# Patient Record
Sex: Female | Born: 2000 | Race: White | Hispanic: No | Marital: Single
Health system: Southern US, Community
[De-identification: ages and names within clinical notes are randomized; demographics above are authoritative.]

## PROBLEM LIST (undated history)

## (undated) HISTORY — PX: WISDOM TOOTH EXTRACTION: SHX21

---
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
## 2019-03-12 ENCOUNTER — Other Ambulatory Visit: Payer: Self-pay

## 2019-03-12 ENCOUNTER — Encounter: Payer: Self-pay | Admitting: Physical Therapy

## 2019-03-12 ENCOUNTER — Ambulatory Visit: Payer: BC Managed Care – PPO | Attending: Physician Assistant | Admitting: Physical Therapy

## 2019-03-12 DIAGNOSIS — M62838 Other muscle spasm: Secondary | ICD-10-CM

## 2019-03-12 DIAGNOSIS — R278 Other lack of coordination: Secondary | ICD-10-CM

## 2019-03-12 DIAGNOSIS — R293 Abnormal posture: Secondary | ICD-10-CM | POA: Diagnosis present

## 2019-03-12 NOTE — Patient Instructions (Addendum)
    Lengthen Back rib by _ shoulder    Lie on   side , pillow between knees  Pull  arm overhead over mattress, grab the edge of mattress,pull it upward, drawing elbow away from ears  Breathing     ___  Open book to the R ( dragging arm, chin tuck)  Handout)   ___  Improving posture / less hunch - lying on back, knees bent, elbows bent   head and shoulder, elbow press exhale shoulders fall down ( do not arch low back)  5 sec,  10 reps   ___ Stretch for pelvic floor   V- slides  "v heels slide away and then back toward buttocks and then rock knee to slight ,  slide heel along at 11 o clock away from buttocks   10 reps       ___   Avoid straining pelvic floor, abdominal muscles , spine  Use log rolling technique instead of getting out of bed with your neck or the sit-up     Log rolling into and out of bed   Log rolling into and out of bed If getting out of bed on R side, Bent knees, scoot hips/ shoulder to L  Raise R arm completely overhead, rolling onto armpit  Then lower bent knees to bed to get into complete side lying position  Then drop legs off bed, and push up onto R elbow/forearm, and use L hand to push onto the bed    ___   Bear stretch at the door way  In between studying    ___  Sitting with feet under knees, on the ground , not crossed   ___  Sleeping on your back and side ( pillow  Between knees if onyour side)  Avoid belly sleeping   ____ Walking with armswings, relaxed

## 2019-03-13 NOTE — Therapy (Signed)
[place] [facility] MAIN [facility] SERVICES [address] [place], [place], [address] Phone: [phone]   Fax:  [phone]  Physical Therapy Evaluation  Patient Details  Name: Regina Pittman MRN: [id number] Date of Birth: [birth year] Referring Provider (PT): Merlinda Frederick    Encounter Date: 03/12/2019  PT End of Session - 03/13/19 1115    Visit Number  1    Number of Visits  10    Date for PT Re-Evaluation  05/22/19    PT Start Time  1100    PT Stop Time  1200    PT Time Calculation (min)  60 min    Activity Tolerance  Patient tolerated treatment well;No increased pain    Behavior During Therapy  [facility] for tasks assessed/performed        Subjective Assessment - 03/12/19 1114    Subjective 1) Pt has never been able to insert a tampon. Whenever she tries. something seems to stop it. Pt has been able to try inserting her pinky but it will not got in all the way. Pt never has had a gynecology exam. Pt takes birth control pill. Before starting the pill, her period has been frequent with bad migraines but the pill has helped her to have regular periods and no more migraines.   2) Difficulty with initiating urination: Pt has to wait a little to start urine flow especially in the morning. Pt has no difficulty completing urination.  Pt has daily bowel movements without straining.    Pertinent History  Physical activity: dance as a child to teenage years. Using treadmill daily at home 1 hours without stretching routine. At college, pt is walking all the time but doesnot keep up with an exercise routine.  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 denied LBP, fall onto tailbone.Pt sleeps on her belly.         [facility] PT Assessment - 03/13/19 1646      Assessment   Medical Diagnosis  vaginal stenosis     Referring Provider (PT)  Merlinda Frederick       Precautions   Precautions  None      Balance Screen   Has the patient  fallen in the past 6 months  No      Observation/Other Assessments   Observations  ankles crossed, slumped sitting     Scoliosis  R lumbar convex , L iliac crest higher       Single Leg Stance   Comments  genu valgus,  L        AROM   Overall AROM Comments  WFL       Strength   Overall Strength Comments  hip flex/ knee flex/ ext 3+/5, R 4+/5 . hip abd L 4-/5, R 3+/5, hip ext 3+/5 with lumbar lordosis       Palpation   Spinal mobility  Increased paraspinal . interspinal on R/ intercostals of lower ribs on R ( decreased post Tx)     SI assessment   L iliac crest. patella higher in standing.  equal ASIS/ medial malleoli in supine  ( psot Tx: standing: iliac crest levelled)        Bed Mobility   Bed Mobility  --   crunch method,      Ambulation/Gait   Gait Comments  minimal arm swing, throacic kyphosis, decreased R stance  Objective measurements completed on examination: See above findings.    Pelvic Floor Special Questions - 03/12/19 1156    External Perineal Exam  through clothing, increased tightness at B pelvic floor. plan to assess externally without clothing at next session        [facility] Adult PT Treatment/Exercise - 03/13/19 1646      Therapeutic Activites    Therapeutic Activities  --   explained pelvic floor anatomy/ deep core function , POC     Neuro Re-ed    Neuro Re-ed Details   Cued for sitting ,standing posture, HEP      Manual Therapy   Manual therapy comments  STM/MWM thoracic region to promote extension, rotation, paraspinals to address lumbar convex curve , medial glide at T/L junction                  PT Long Term Goals - 03/13/19 1116      PT LONG TERM GOAL #1   Title  Pt will demo decreased muscle tightness at pelvic floor all 3 layers  and demo proper lengthening to faciliate insertion of tampon    Time  8    Period  Weeks    Status  New    Target Date  05/08/19      PT LONG TERM GOAL #2   Title  Pt will  report being able to wear a tampon for 4 hours without discomfort in order to improve QOL    Time  10    Period  Weeks    Status  New    Target Date  05/22/19      PT LONG TERM GOAL #3   Title  Pt will demo proper body mechanics without cues  in order to minimzie strianing pelvic floor    Time  4    Period  Weeks    Status  New    Target Date  04/10/19      PT LONG TERM GOAL #4   Title  Pt will demo no forward head / thoracic kyphosis across 2 weeks in order to yield better intraabdominal pressure system for pelvic function    Time  4    Period  Weeks    Status  New    Target Date  04/10/19             Plan - 03/13/19 1648    Clinical Impression Statement  Pt is a 19 yo female who reports difficulty with inserting and wearing tampons in addition to difficulty with initiating urination which impact her QOL. Pt's MSK presentations include spinal deviations ( L lumbar convex curve), asymmetrical mm tensions, hypomobility at thoracic spine, forward head posture, increased pelvic floor mm tensions, poor posture and body mechanics which places strain/ overactivity of pelvic floor. Pt tolerated Tx without complaints after which pt demo'd increased thoracic extension, cervical retraction, scapular stabilization, less spinal deviations, and improved gait. Initiated pt on spinal and pelvic floor stretches.   Pt was provided education on etiology of Sx with anatomy, physiology explanation with images along with the benefits of customized pelvic PT Tx based on pt's medical conditions and musculoskeletal deficits.  Explained the physiology of deep core mm coordination and roles of pelvic floor function in urination, defecation, sexual function, and postural control with deep core mm system.   Pt benefits from skilled PT. Pt will be able to come to PT once a month due to pt being at an out of state college. Telehealth  was not an option due to DPT not licensed in [facility] where pt attends college.       Examination-Activity Limitations  Other;Hygiene/Grooming    Stability/Clinical Decision Making  Evolving/Moderate complexity    Clinical Decision Making  Moderate    Rehab Potential  Good    PT Frequency  1x / month    PT Duration  12 weeks   PT Treatment/Interventions  Therapeutic activities;Therapeutic exercise;Neuromuscular re-education;Manual techniques;Patient/family education;Energy conservation;Taping;Moist Heat;Scar mobilization;Gait training    Consulted and Agree with Plan of Care  Patient       Patient will benefit from skilled therapeutic intervention in order to improve the following deficits and impairments:  Decreased activity tolerance, Hypomobility, Decreased mobility, Decreased range of motion, Decreased coordination, Improper body mechanics, Decreased endurance, Increased fascial restricitons, Increased muscle spasms, Hypermobility, Postural dysfunction  Visit Diagnosis: Other muscle spasm  Abnormal posture  Other lack of coordination     Problem List There are no problems to display for this patient.   Mariane Masters ,PT, DPT, E-RYT  03/13/2019, 4:57 PM  [place] [facility] MAIN [facility] SERVICES [address] [place], [place], [address] Phone: [phone]   Fax:  [phone]  Name: Regina Pittman MRN: [id number] Date of Birth: [birth year]

## 2019-04-05 ENCOUNTER — Other Ambulatory Visit: Payer: Self-pay

## 2019-04-05 ENCOUNTER — Ambulatory Visit: Payer: BC Managed Care – PPO | Attending: Physician Assistant | Admitting: Physical Therapy

## 2019-04-05 DIAGNOSIS — M533 Sacrococcygeal disorders, not elsewhere classified: Secondary | ICD-10-CM | POA: Insufficient documentation

## 2019-04-05 DIAGNOSIS — R278 Other lack of coordination: Secondary | ICD-10-CM | POA: Diagnosis present

## 2019-04-05 DIAGNOSIS — M62838 Other muscle spasm: Secondary | ICD-10-CM | POA: Diagnosis present

## 2019-04-05 DIAGNOSIS — R293 Abnormal posture: Secondary | ICD-10-CM | POA: Diagnosis present

## 2019-04-05 NOTE — Patient Instructions (Addendum)
Stretch for pelvic floor   V- slides  "v heels slide away and then back toward buttocks and then rock knee to slight ,  slide heel along at 11 o clock away from buttocks   10 reps    Happy Baby 5 breaths  Spread toes and relax them   Pelvic Tilt    On belly: Riding horse edge of mattress  knee bent like riding a horse, move knee towards armpit and out  10 reps   Childs pose rocking on forearms ( to stretch midback as well)   Rest with pillow under chest/ belly  10 breath -   Mermaid stretch  Rocking while seated on the floor with heels to one side of the hip Heels to one side of the hip  Rock forward towards the knee that is bent , rock beck towards the opposite sitting bones   ___   Deep core 1 and 2 ( handout)  ____  To prepare for inserting tampons   Self-palpation with 1st knuckle in relaxed on back with pillows propped or one foot propped ( not in shower to no slip)  Notice inhale, opening of pelvic floor   Every other day after showering with clean fingers  Buildup to 2 knuckle depth    ___ minimze flip flopwearing  ___ Standing with more weight on the outside of foot, big ballmound done, thighs out, relax tailbone to ground like dinosaur tail

## 2019-04-06 NOTE — Therapy (Signed)
[place] MAIN [facility] SERVICES [address] [place], [place], [address] Phone: [phone]   Fax:  [phone]  Physical Therapy Treatment  Patient Details  Name: Regina Pittman MRN: [id number] Date of Birth: 06-18-2000 Referring Provider (PT): Carrie Mew    Encounter Date: 04/05/2019  PT End of Session - 04/05/19 1707    Visit Number  2    Number of Visits  10    Date for PT Re-Evaluation  05/22/19    PT Start Time  8366    PT Stop Time  1700    PT Time Calculation (min)  57 min    Activity Tolerance  Patient tolerated treatment well;No increased pain    Behavior During Therapy  [facility] for tasks assessed/performed         Subjective Assessment - 04/05/19 1606    Subjective  Pt reports she is more aware of her posture. Pt has tried sleep on her back but she finds herself on her belly upon waking.    Pertinent History  Physical activity: dance as a child to teenage years. Using treadmill daily at home 1 hours without stretching routine. At college, pt is walking all the time but doesnot keep up with an exercise routine.  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 denied LBP, fall onto tailbone.Pt sleeps on her belly.         [facility] PT Assessment - 04/06/19 1043      Observation/Other Assessments   Observations  upright posture, no rounded shoulders   wearing flip flops, flexed toes               Pelvic Floor Special Questions - 04/06/19 1043    External Perineal Exam  through clothing, increased tightness at ischiocavernosus/ bulbospongiosus, deep transversal perineal, coccygeus B          [facility] Adult PT Treatment/Exercise - 04/06/19 1042      Neuro Re-ed    Neuro Re-ed Details   cued for pelvic tilt, pelvic floor stretches, deep core       Manual Therapy   Manual therapy comments  externally through clothing : STM/MWM at mm noted in assessment                    PT Long Term Goals - 03/13/19 1116      PT LONG TERM GOAL #1   Title  Pt will demo decreased muscle tightness at pelvic floor all 3 layers  and demo proper lengthening to faciliate insertion of tampon    Time  8    Period  Weeks    Status  New    Target Date  05/08/19      PT LONG TERM GOAL #2   Title  Pt will report being able to wear a tampon for 4 hours without discomfort in order to improve QOL    Time  10    Period  Weeks    Status  New    Target Date  05/22/19      PT LONG TERM GOAL #3   Title  Pt will demo proper body mechanics without cues  in order to minimzie strianing pelvic floor    Time  4    Period  Weeks    Status  New    Target Date  04/10/19      PT LONG TERM GOAL #4   Title  Pt will demo no  forward head / thoracic kyphosis across 2 weeks in order to yield better intraabdominal pressure system for pelvic function    Time  4    Period  Weeks    Status  New    Target Date  04/10/19            Plan - 04/05/19 1708    Clinical Impression Statement  Pt demo'd equal alignment of pelvic girdle and less rounded shoulders which indicates good carry over from last session. Provided external manual Tx to minimize pelvic floor mm tightness which pt tolerated without complaints. Pt demo'd improved pelvic floor lengthening and was progressed to deep core coordinaiton and strengthening. Plan to apply regional interdependent approach as pt demo'd lower kinetic chain deficits ( genu valgus, medial collapse of feet, supination/ inversion) that contributes to pelvic floor overactivity. Pt continues to benefit from skilled PT   Examination-Activity Limitations  Other;Hygiene/Grooming    Stability/Clinical Decision Making  Evolving/Moderate complexity    Rehab Potential  Good    PT Frequency  Monthy    PT Duration  12 weeks    PT Treatment/Interventions  Therapeutic activities;Therapeutic exercise;Neuromuscular re-education;Manual  techniques;Patient/family education;Energy conservation;Taping;Moist Heat;Scar mobilization;Gait training    Consulted and Agree with Plan of Care  Patient       Patient will benefit from skilled therapeutic intervention in order to improve the following deficits and impairments:  Decreased activity tolerance, Hypomobility, Decreased mobility, Decreased range of motion, Decreased coordination, Improper body mechanics, Decreased endurance, Increased fascial restricitons, Increased muscle spasms, Hypermobility, Postural dysfunction  Visit Diagnosis: Other muscle spasm  Abnormal posture  Other lack of coordination     Problem List There are no problems to display for this patient.   Mariane Masters ,PT, DPT, E-RYT  04/06/2019, 10:44 AM  [place] [facility] MAIN [facility] SERVICES [address] [place], [place], [address] Phone: [phone]   Fax:  [phone]  Name: Yina Riviere MRN: [id number] Date of Birth: July 01, 2000

## 2019-04-19 ENCOUNTER — Ambulatory Visit: Payer: BC Managed Care – PPO

## 2019-05-10 ENCOUNTER — Other Ambulatory Visit: Payer: Self-pay

## 2019-05-10 ENCOUNTER — Ambulatory Visit: Payer: BC Managed Care – PPO | Attending: Physician Assistant | Admitting: Physical Therapy

## 2019-05-10 DIAGNOSIS — M62838 Other muscle spasm: Secondary | ICD-10-CM

## 2019-05-10 DIAGNOSIS — R278 Other lack of coordination: Secondary | ICD-10-CM

## 2019-05-10 DIAGNOSIS — R293 Abnormal posture: Secondary | ICD-10-CM | POA: Insufficient documentation

## 2019-05-10 NOTE — Therapy (Addendum)
[place] MAIN [facility] SERVICES [address] [place], [place], [address] Phone: [phone]   Fax:  [phone]  Physical Therapy Treatment  Patient Details  Name: Regina Pittman MRN: [id number] Date of Birth: 03-30-2000 Referring Provider (PT): Carrie Mew    Encounter Date: 05/10/2019  PT End of Session - 05/10/19 1650    Visit Number  3    Number of Visits  10    Date for PT Re-Evaluation  05/22/19    PT Start Time  1300    PT Stop Time  1430    PT Time Calculation (min)  90 min    Activity Tolerance  Patient tolerated treatment well;No increased pain    Behavior During Therapy  [facility] for tasks assessed/performed         Subjective Assessment - 05/10/19 1513    Subjective  Pt reported she was able to insert her finger up to one knuckle depth across the past month.  Pt has been paying attentiont her posture and standing evenly across her feet and not just on the inner arches.    Pertinent History  Physical activity: dance as a child to teenage years. Using treadmill daily at home 1 hours without stretching routine. At college, pt is walking all the time but doesnot keep up with an exercise routine.  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 denied LBP, fall onto tailbone.Pt sleeps on her belly.         [facility] PT Assessment - 05/10/19 1621      Squat   Comments  knees anterior to feet, posterior weight on heels                Pelvic Floor Special Questions - 05/10/19 1603    External Perineal Exam  tightness at ischial fossa/ rami, SIJ L > R          [facility] Adult PT Treatment/Exercise - 05/10/19 1603      Therapeutic Activites    Therapeutic Activities  Other Therapeutic Activities    Other Therapeutic Activities  explained the role of nervous system on mm tensions and technique fo body scan for relaxation , guided pt technique       Neuro Re-ed    Neuro Re-ed Details    cued for scapulothoracic strengthening with bands, sidestep minisquat techniques       Modalities   Modalities  Moist Heat      Moist Heat Therapy   Number Minutes Moist Heat  5 Minutes    Moist Heat Location  --   perineum during new HEP instructions      Manual Therapy   Manual therapy comments  STM/MWM  ischial fossa/ rami, AP mob Grade III FABDER R hip                   PT Long Term Goals - 05/10/19 1653      PT LONG TERM GOAL #1   Title  Pt will demo decreased muscle tightness at pelvic floor all 3 layers  and demo proper lengthening to faciliate insertion of tampon    Time  8    Period  Weeks    Status  Partially Met      PT LONG TERM GOAL #2   Title  Pt will report being able to wear a tampon for 4 hours without discomfort in order to improve QOL    Time  10  Period  Weeks    Status  On-going      PT LONG TERM GOAL #3   Title  Pt will demo proper body mechanics without cues  in order to minimzie strianing pelvic floor    Time  4    Period  Weeks    Status  On-going      PT LONG TERM GOAL #4   Title  Pt will demo no forward head / thoracic kyphosis across 2 weeks in order to yield better intraabdominal pressure system for pelvic function    Time  4    Period  Weeks    Status  Partially Met      PT LONG TERM GOAL #5   Title  Pt will demo IND with resistance band and fitness exercsies with proper technique and modifications to minimize overactivity of pelvic floor    Time  8    Period  Weeks    Status  New    Target Date  07/05/19            Plan - 05/10/19 1651    Clinical Impression Statement  Pt is progressing well with decreased pelvic floor mm tightness of 1st layer mm but requried further external treatments to decrease tensions at 2-3rd layers ( posterior > anterior) today. Pt demo'd increased mobility, lengthening of pelvic floor mm, and less tenderness post Tx. Progressed with relaxation training with body scan technique. Progressed  with minisquats, scapulothoracic strengthening with cues for proper tecnique and alignment to promote lower kinetic chain co-activation to minimize medial collapse of arches and thoracic kyphosis. Provided education on dilator use and plan to educate thoroughly at next session. Pt continues to benefit from skilled PT.    Examination-Activity Limitations  Other;Hygiene/Grooming    Stability/Clinical Decision Making  Evolving/Moderate complexity    Rehab Potential  Good    PT Frequency  Monthy    PT Duration  12 weeks    PT Treatment/Interventions  Therapeutic activities;Therapeutic exercise;Neuromuscular re-education;Manual techniques;Patient/family education;Energy conservation;Taping;Moist Heat;Scar mobilization;Gait training    Consulted and Agree with Plan of Care  Patient       Patient will benefit from skilled therapeutic intervention in order to improve the following deficits and impairments:  Decreased activity tolerance, Hypomobility, Decreased mobility, Decreased range of motion, Decreased coordination, Improper body mechanics, Decreased endurance, Increased fascial restricitons, Increased muscle spasms, Hypermobility, Postural dysfunction  Visit Diagnosis: Other muscle spasm  Abnormal posture  Other lack of coordination     Problem List There are no problems to display for this patient.   Jerl Mina ,PT, DPT, E-RYT  05/10/2019, 4:56 PM  [place] MAIN [facility] SERVICES [address] [place], [place], [address] Phone: [phone]   Fax:  [phone]  Name: Regina Pittman MRN: [id number] Date of Birth: July 21, 2000

## 2019-05-10 NOTE — Patient Instructions (Addendum)
Lying on back, knees bent    band under ballmounds  while laying on back w/ knees bent  "W" exercise with blue band   10 reps x 2 sets   Band is placed under feet, knees bent, feet are hip width apart Hold band with thumbs point out, keep upper arm and elbow touching the bed the whole time  - inhale and then exhale pull bands by bending elbows hands move in a "w"  (feel shoulder blades squeezing)    ___  Minisquat: Scoot buttocks back slight, hinge like you are looking at your reflection on a pond  Knees behind toes,  Inhale to "smell flowers"  Exhale on the rise "like rocket"  Do not lock knees, have more weight across ballmounds of feet, toes relaxed   THEN HALF step on both feet first lap with left foot leading down a hall way = 1 lap  Repeated with other foot leading =1 lap  2 laps each side    After wards:  stretch figure 4  5 breaths   ____     Walking with band, thumbs up and hands held behind back by back pockets , body face away from door  3 steps forward and back   6 mins    Wider knees,  ballmounds not just on the outside of foot   slow stepping back with shoulders ahead, ballmounds down and then heel       __  Body scan ( emailed) for relaxation

## 2019-06-26 ENCOUNTER — Other Ambulatory Visit: Payer: Self-pay

## 2019-06-26 ENCOUNTER — Ambulatory Visit: Payer: BC Managed Care – PPO | Attending: Physician Assistant | Admitting: Physical Therapy

## 2019-06-26 DIAGNOSIS — M533 Sacrococcygeal disorders, not elsewhere classified: Secondary | ICD-10-CM | POA: Insufficient documentation

## 2019-06-26 DIAGNOSIS — R278 Other lack of coordination: Secondary | ICD-10-CM | POA: Insufficient documentation

## 2019-06-26 DIAGNOSIS — R293 Abnormal posture: Secondary | ICD-10-CM | POA: Insufficient documentation

## 2019-06-26 DIAGNOSIS — M62838 Other muscle spasm: Secondary | ICD-10-CM | POA: Insufficient documentation

## 2019-06-26 NOTE — Patient Instructions (Signed)
Pelvic circles,   Marjo Bicker pose paying attention to hands and shoulder down, chin tuck, toes tucked  and not dipping lowback   Restorative pose with folded flat sheet to place under rib and mid thigh for gentle pelvic tilt anterior

## 2019-06-27 NOTE — Therapy (Signed)
[place] MAIN [facility] SERVICES [address] [place], [place], [address] Phone: [phone]   Fax:  [phone]  Physical Therapy Treatment / Progress Note  Patient Details  Name: Regina Pittman MRN: [id number] Date of Birth: 03/13/2000 Referring Provider (PT): Carrie Mew    Encounter Date: 06/26/2019  PT End of Session - 06/26/19 1751    Visit Number  4    Number of Visits  14    Date for PT Re-Evaluation  09/04/19    PT Start Time  0258    PT Stop Time  1704    PT Time Calculation (min)  61 min    Activity Tolerance  Patient tolerated treatment well;No increased pain    Behavior During Therapy  [facility] for tasks assessed/performed        Subjective Assessment - 06/26/19 1707    Subjective  Pt has not been able to insert a tampon.    Pertinent History  Physical activity: dance as a child to teenage years. Using treadmill daily at home 1 hours without stretching routine. At college, pt is walking all the time but doesnot keep up with an exercise routine.  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 denied LBP, fall onto tailbone.Pt sleeps on her belly.                     Pelvic Floor Special Questions - 06/27/19 1541    External Palpation  flexed coccyx     Pelvic Floor Internal Exam  pt consented verbally without contraindications     Exam Type  Vaginal    Palpation  tightness/ tenderness  at 6 o' clock , 5 -7 o'clock  1-2nd layer         [facility] Adult PT Treatment/Exercise - 06/27/19 1544      Neuro Re-ed    Neuro Re-ed Details   cued for anterior tilt of pelvic floor      Modalities   Modalities  Moist Heat      Moist Heat Therapy   Number Minutes Moist Heat  5 Minutes    Moist Heat Location  --   perineum through sheets     Manual Therapy   Internal Pelvic Floor  STM/MWM at 5-7 o clock 1-2 nd layers,                   PT Long Term Goals - 06/27/19  1547      PT LONG TERM GOAL #1   Title  Pt will demo decreased muscle tightness at pelvic floor all 3 layers  and demo proper lengthening to faciliate insertion of tampon    Time  8    Period  Weeks    Status  Partially Met      PT LONG TERM GOAL #2   Title  Pt will report being able to wear a tampon for 4 hours without discomfort in order to improve QOL    Time  10    Period  Weeks    Status  On-going      PT LONG TERM GOAL #3   Title  Pt will demo proper body mechanics without cues  in order to minimzie strianing pelvic floor    Time  4    Period  Weeks    Status  On-going      PT LONG TERM GOAL #4   Title  Pt will demo no  forward head / thoracic kyphosis across 2 weeks in order to yield better intraabdominal pressure system for pelvic function    Time  4    Period  Weeks    Status  Achieved      PT LONG TERM GOAL #5   Title  Pt will demo IND with resistance band and fitness exercsies with proper technique and modifications to minimize overactivity of pelvic floor    Time  8    Period  Weeks    Status  On-going            Plan - 06/26/19 1716    Clinical Impression Statement Pt is progressing well across the past 4 visits with improved posture, less hip and back mm tightness, gradually decreasing pelvic floor mm tightness. Pt no longer demo hyperextended / genu valgus knees/ pronation of feet which will help minimize further tightness of pelvic floor. Pt's coccyx was in a flexed position which was addressed with manual Tx and further internal manual Tx was required to minimize tightness of mm at 1-2 nd layers. After performing external Tx to correct flexed coccyx, pt demo'd increased lengthening of 2nd layer of pelvic floor with less tenderness. Pt continues to benefit from skilled PT.  Pt had been coming once a month due to her college schedule and will be able to come more frequently in the upcoming weeks which will help with her progress.     Examination-Activity  Limitations  Other;Hygiene/Grooming    Stability/Clinical Decision Making  Evolving/Moderate complexity    Rehab Potential  Good    PT Frequency  Monthy    PT Duration  12 weeks    PT Treatment/Interventions  Therapeutic activities;Therapeutic exercise;Neuromuscular re-education;Manual techniques;Patient/family education;Energy conservation;Taping;Moist Heat;Scar mobilization;Gait training    Consulted and Agree with Plan of Care  Patient       Patient will benefit from skilled therapeutic intervention in order to improve the following deficits and impairments:  Decreased activity tolerance, Hypomobility, Decreased mobility, Decreased range of motion, Decreased coordination, Improper body mechanics, Decreased endurance, Increased fascial restricitons, Increased muscle spasms, Hypermobility, Postural dysfunction  Visit Diagnosis: Other muscle spasm  Abnormal posture  Other lack of coordination     Problem List There are no problems to display for this patient.   Jerl Mina ,PT, DPT, E-RYT  06/27/2019, 3:47 PM  [place] MAIN [facility] SERVICES [address] [place], [place], [address] Phone: [phone]   Fax:  [phone]  Name: Regina Pittman MRN: [id number] Date of Birth: 07-Jul-2000

## 2019-07-03 ENCOUNTER — Other Ambulatory Visit: Payer: Self-pay

## 2019-07-03 ENCOUNTER — Ambulatory Visit: Payer: BC Managed Care – PPO | Admitting: Physical Therapy

## 2019-07-03 DIAGNOSIS — M62838 Other muscle spasm: Secondary | ICD-10-CM | POA: Diagnosis not present

## 2019-07-03 DIAGNOSIS — R278 Other lack of coordination: Secondary | ICD-10-CM

## 2019-07-03 DIAGNOSIS — R293 Abnormal posture: Secondary | ICD-10-CM

## 2019-07-04 NOTE — Patient Instructions (Signed)
Feet care :  Self -feet massage  ? ?Handshake : fingers between toes, moving ballmounds/toes back and forth several times while other hand anchors at arch. Do the same at the hind/mid foot.  ?Heel to toes upward to a letter Big Letter T strokes to spread ballmounds and toes, several times, pinch between webs of toes  ?Run finger tips along top of foot between long bones "comb between the bones"  ? ? ?Wiggle toes and spread them out when relaxing  ? ?

## 2019-07-04 NOTE — Therapy (Signed)
[place] MAIN [facility] SERVICES [address] [place], [place], [address] Phone: [phone]   Fax:  [phone]  Physical Therapy Treatment  Patient Details  Name: Regina Pittman MRN: [id number] Date of Birth: [birth year] Referring Provider (PT): Carrie Mew    Encounter Date: 07/03/2019  PT End of Session - 07/04/19 0955    Visit Number  5    Number of Visits  14    Date for PT Re-Evaluation  09/04/19    PT Start Time  4403    PT Stop Time  4742    PT Time Calculation (min)  50 min    Activity Tolerance  Patient tolerated treatment well;No increased pain    Behavior During Therapy  [facility] for tasks assessed/performed          Subjective Assessment - 07/04/19 0955    Subjective  Pt felt sore after last session but it felt better.    Pertinent History  Physical activity: dance as a child to teenage years. Using treadmill daily at home 1 hours without stretching routine. At college, pt is walking all the time but doesnot keep up with an exercise routine.  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 denied LBP, fall onto tailbone.Pt sleeps on her belly.         [facility] PT Assessment - 07/04/19 0956      Palpation   Palpation comment  R instrinsic feet mm tight > L, B adductor mm, glut  tightness                  Pelvic Floor Special Questions - 07/04/19 0956    Pelvic Floor Internal Exam  pt consented verbally without contraindications     Exam Type  Vaginal    Palpation  tightness/ tenderness at ATLA/ obt int, ischial spine B  at 3rd layer         [facility] Adult PT Treatment/Exercise - 07/04/19 0957      Therapeutic Activites    Other Therapeutic Activities  explained the connection of feet relaxation to pelvic floor mm , adductor , glut relaxation      Neuro Re-ed    Neuro Re-ed Details   cued for pelvic tilt and feet message technique       Modalities   Modalities  Moist  Heat      Moist Heat Therapy   Number Minutes Moist Heat  5 Minutes    Moist Heat Location  Other (comment)   perineuma nd buttocks     Manual Therapy   Internal Pelvic Floor  STM/MWM at at 3rd layer, pt was tearful at tenderness spots at deeper areas, thus, withdrew pressure and further withdrew from internal technique when pt requested. Resorted to external techniques to minimze tightness at gluts, adductors and intrinsic feet mm                   PT Long Term Goals - 06/27/19 1547      PT LONG TERM GOAL #1   Title  Pt will demo decreased muscle tightness at pelvic floor all 3 layers  and demo proper lengthening to faciliate insertion of tampon    Time  8    Period  Weeks    Status  Partially Met      PT LONG TERM GOAL #2   Title  Pt will report being able to wear a tampon for 4 hours without discomfort  in order to improve QOL    Time  10    Period  Weeks    Status  On-going      PT LONG TERM GOAL #3   Title  Pt will demo proper body mechanics without cues  in order to minimzie strianing pelvic floor    Time  4    Period  Weeks    Status  On-going      PT LONG TERM GOAL #4   Title  Pt will demo no forward head / thoracic kyphosis across 2 weeks in order to yield better intraabdominal pressure system for pelvic function    Time  4    Period  Weeks    Status  Achieved      PT LONG TERM GOAL #5   Title  Pt will demo IND with resistance band and fitness exercsies with proper technique and modifications to minimize overactivity of pelvic floor    Time  8    Period  Weeks    Status  On-going            Plan - 07/04/19 0955    Clinical Impression Statement  Pt showed decreased 1-2 nd layer mm tensions with increased tensions at 3rd layer. Internal techniques were modified tro external techniques when pt reported tenderness and need to end internal Tx. Pt demo'd increased tightness of R intrinsic feet > L, B adductor, and gluts which were addressed.  Pt  continues to show less genu valgus which will help with longer lasting benefits from today's treatments. Explained to pt about the connection of lower kinetic chain to pelvic floor tightness. Pt was educated on self feet massage. Pt demo'd increased toe abduction and more midfoot mobility, decreased adductor/ glut tightness post Tx. Pt continues to benefit from skilled PT    Examination-Activity Limitations  Other;Hygiene/Grooming    Stability/Clinical Decision Making  Evolving/Moderate complexity    Rehab Potential  Good    PT Frequency  Monthy    PT Duration  12 weeks    PT Treatment/Interventions  Therapeutic activities;Therapeutic exercise;Neuromuscular re-education;Manual techniques;Patient/family education;Energy conservation;Taping;Moist Heat;Scar mobilization;Gait training    Consulted and Agree with Plan of Care  Patient       Patient will benefit from skilled therapeutic intervention in order to improve the following deficits and impairments:  Decreased activity tolerance, Hypomobility, Decreased mobility, Decreased range of motion, Decreased coordination, Improper body mechanics, Decreased endurance, Increased fascial restricitons, Increased muscle spasms, Hypermobility, Postural dysfunction  Visit Diagnosis: Other muscle spasm  Abnormal posture  Other lack of coordination     Problem List There are no problems to display for this patient.   Regina Pittman ,PT, DPT, E-RYT  07/04/2019, 10:02 AM  [place] MAIN [facility] SERVICES [address] [place], [place], [address] Phone: [phone]   Fax:  [phone]  Name: Regina Pittman MRN: [id number] Date of Birth: [birth year]

## 2019-07-10 ENCOUNTER — Other Ambulatory Visit: Payer: Self-pay

## 2019-07-10 ENCOUNTER — Ambulatory Visit: Payer: BC Managed Care – PPO | Admitting: Physical Therapy

## 2019-07-10 DIAGNOSIS — R293 Abnormal posture: Secondary | ICD-10-CM

## 2019-07-10 DIAGNOSIS — M62838 Other muscle spasm: Secondary | ICD-10-CM | POA: Diagnosis not present

## 2019-07-10 DIAGNOSIS — R278 Other lack of coordination: Secondary | ICD-10-CM

## 2019-07-11 NOTE — Patient Instructions (Signed)
Warrior I : Feet are hip width apart, one behind like you are on ski tracks, front knee bent over ankle but not more forward then the ankle.  Make sure 50% weight is in the front foot/leg , 50% weight is the back foot/ leg   Arms up like "V" , drop shoulders down  Palms together at the hears  3 breaths here.   Warrior II:  Raise arm up in front on the same side as your front knee, back arm up behind. Arms are aligned with the length of the mat Inhale,  Exhale and relax shoulders and ribcage  Make sure 50% weight is in the front foot/leg , 50% weight is the back foot/ leg   3 breaths here.   Extended side angle :  Feet are hip width apart, L foot one behind like you are on ski tracks,  R knee bent over ankle but not more forward then the ankle.  Make sure 50% weight is in the front foot/leg , 50% weight is the back foot/ leg    Rest R forearm lightly on top of thigh,  L hand on L hip.  Inhale lengthen spine,   Exhale turn navel to the L then the ribcage turns, look at the other wall.  Keep maintaining  50% weight is in the front foot/leg , 50% weight is the back foot/ leg  And make sure the front knee is still pointed in the toe line of the 2nd toe.   3 breaths here.    Warrior III:   ( like "T")    Unlock knee of standing leg.  lean forward/ perpendicular to ground on leg in the air, hips squared to floor, toes of back leg still on floor  TO AVOID LOW BACK PAIN, LIFT LEG AT A  LESS ANGLE FROM THE FLOOR and KEEP GAZE DOWN TO NOT PUFF OUT THE CHEST    3 breaths here.

## 2019-07-11 NOTE — Therapy (Signed)
[place] MAIN [facility] SERVICES [address] [place], [place], [address] Phone: [phone]   Fax:  [phone]  Physical Therapy Treatment  Patient Details  Name: Zuley Lutter MRN: [id number] Date of Birth: 04-09-2000 Referring Provider (PT): Carrie Mew    Encounter Date: 07/10/2019   PT End of Session - 07/10/19 1703    Visit Number 6    Number of Visits 14    Date for PT Re-Evaluation 09/04/19    PT Start Time 1602    PT Stop Time 1700    PT Time Calculation (min) 58 min    Activity Tolerance Patient tolerated treatment well;No increased pain    Behavior During Therapy [facility] for tasks assessed/performed           No past medical history on file.     Subjective Assessment - 07/11/19 1332    Subjective Pt is still not able to insert a tampon in yet    Pertinent History Physical activity: dance as a child to teenage years. Using treadmill daily at home 1 hours without stretching routine. At college, pt is walking all the time but doesnot keep up with an exercise routine.  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 denied LBP, fall onto tailbone.Pt sleeps on her belly.              [facility] PT Assessment - 07/11/19 1320      Palpation   Palpation comment L ITBand, tib ant tightness , hypomobile midfoot joint R                          [facility] Adult PT Treatment/Exercise - 07/11/19 1317      Therapeutic Activites    Other Therapeutic Activities explained principles of alignment in yoga poses       Neuro Re-ed    Neuro Re-ed Details  cued for improved knee alignment in yoga poses       Modalities   Modalities Moist Heat      Moist Heat Therapy   Number Minutes Moist Heat 5 Minutes    Moist Heat Location Other (comment)   perineum     Manual Therapy   Manual therapy comments STM/MWM tib ant, ITband     Internal Pelvic Floor STM/MWM at deeper layers of pelvic  floor, but still required lighter pressure                          PT Long Term Goals - 06/27/19 1547      PT LONG TERM GOAL #1   Title Pt will demo decreased muscle tightness at pelvic floor all 3 layers  and demo proper lengthening to faciliate insertion of tampon    Time 8    Period Weeks    Status Partially Met      PT LONG TERM GOAL #2   Title Pt will report being able to wear a tampon for 4 hours without discomfort in order to improve QOL    Time 10    Period Weeks    Status On-going      PT LONG TERM GOAL #3   Title Pt will demo proper body mechanics without cues  in order to minimzie strianing pelvic floor    Time 4    Period Weeks    Status On-going      PT LONG TERM  GOAL #4   Title Pt will demo no forward head / thoracic kyphosis across 2 weeks in order to yield better intraabdominal pressure system for pelvic function    Time 4    Period Weeks    Status Achieved      PT LONG TERM GOAL #5   Title Pt will demo IND with resistance band and fitness exercsies with proper technique and modifications to minimize overactivity of pelvic floor    Time 8    Period Weeks    Status On-going                 Plan - 07/11/19 1329    Clinical Impression Statement Pt demo'd less tightness at superficial layers of pelvic floor and tolerated internal Tx at deeper layers. External techniques were applied first to minimize L lower kinetic chain mm tightness where pt reported past injuries. Applied this approach because pt demo'd increased L sided pelvic floor tightness at last session. This approached with external techniques first facilitated less tenderness and was more tolerable to pt.   Pt's lower kinetic chain deficits ( genu valgus, pronation) were addressed with neuromuscular red-education with standing yoga poses. Pt required excessive cues for knee alignment and feet mechanics. Anticipate that by addressing these lower kinetic chain issues,   longer lasting  changes will be made toward improving  pelvic floor's tightness. Pt continues to benefit from skilled PT     Examination-Activity Limitations Other;Hygiene/Grooming    Stability/Clinical Decision Making Evolving/Moderate complexity    Rehab Potential Good    PT Frequency Monthy    PT Duration 12 weeks    PT Treatment/Interventions Therapeutic activities;Therapeutic exercise;Neuromuscular re-education;Manual techniques;Patient/family education;Energy conservation;Taping;Moist Heat;Scar mobilization;Gait training    Consulted and Agree with Plan of Care Patient           Patient will benefit from skilled therapeutic intervention in order to improve the following deficits and impairments:  Decreased activity tolerance, Hypomobility, Decreased mobility, Decreased range of motion, Decreased coordination, Improper body mechanics, Decreased endurance, Increased fascial restricitons, Increased muscle spasms, Hypermobility, Postural dysfunction  Visit Diagnosis: Other muscle spasm  Abnormal posture  Other lack of coordination     Problem List There are no problems to display for this patient.   Jerl Mina ,PT, DPT, E-RYT  07/11/2019, 1:35 PM  [place] MAIN [facility] SERVICES [address] [place], [place], [address] Phone: [phone]   Fax:  [phone]  Name: Virlee Stroschein MRN: [id number] Date of Birth: [birth year]

## 2019-07-19 ENCOUNTER — Ambulatory Visit: Payer: BC Managed Care – PPO | Admitting: Physical Therapy

## 2019-07-19 ENCOUNTER — Other Ambulatory Visit: Payer: Self-pay

## 2019-07-19 DIAGNOSIS — M62838 Other muscle spasm: Secondary | ICD-10-CM

## 2019-07-19 DIAGNOSIS — R293 Abnormal posture: Secondary | ICD-10-CM

## 2019-07-19 DIAGNOSIS — R278 Other lack of coordination: Secondary | ICD-10-CM

## 2019-07-19 NOTE — Therapy (Signed)
[place] MAIN [facility] SERVICES [address] [place], [place], [address] Phone: [phone]   Fax:  [phone]  Physical Therapy Treatment  Patient Details  Name: Regina Pittman MRN: [id number] Date of Birth: 21-Feb-2000 Referring Provider (PT): Carrie Mew    Encounter Date: 07/19/2019   PT End of Session - 07/19/19 1112    Visit Number 7    Number of Visits 14    Date for PT Re-Evaluation 09/04/19    PT Start Time 1108    PT Stop Time 1205    PT Time Calculation (min) 57 min    Activity Tolerance Patient tolerated treatment well;No increased pain    Behavior During Therapy [facility] for tasks assessed/performed              Subjective Assessment - 07/19/19 1111    Subjective Pt was not as sore after last session    Pertinent History Physical activity: dance as a child to teenage years. Using treadmill daily at home 1 hours without stretching routine. At college, pt is walking all the time but doesnot keep up with an exercise routine.  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 denied LBP, fall onto tailbone.Pt sleeps on her belly.                          Pelvic Floor Special Questions - 07/19/19 1259    External Palpation hypomobile SIJ and attachments at ischial rami / tuberosity     Pelvic Floor Internal Exam pt consented verbally without contraindications     Exam Type Vaginal    Palpation tightness/ tenderness at ATLA/ obt int, ischial spine B  at 3rd layer, deferred to external techniques at ischial rami/ tuberosity L > R   and coccyx/ sacrum               [facility] Adult PT Treatment/Exercise - 07/19/19 1300      Neuro Re-ed    Neuro Re-ed Details  cued for technique for posterior pelvic floor stretches and hamstrings       Modalities   Modalities Moist Heat      Moist Heat Therapy   Number Minutes Moist Heat 5 Minutes    Moist Heat Location --   sacrum      Manual Therapy   Manual therapy comments STM/MWM and AP mob Grade III at sacrum  STM/MWM B ischial rami / tuberosity, coccygeus     Internal Pelvic Floor STM/MWM at deeper layers of pelvic floor, but still required lighter pressure                          PT Long Term Goals - 06/27/19 1547      PT LONG TERM GOAL #1   Title Pt will demo decreased muscle tightness at pelvic floor all 3 layers  and demo proper lengthening to faciliate insertion of tampon    Time 8    Period Weeks    Status Partially Met      PT LONG TERM GOAL #2   Title Pt will report being able to wear a tampon for 4 hours without discomfort in order to improve QOL    Time 10    Period Weeks    Status On-going      PT LONG TERM GOAL #3   Title Pt will demo proper body mechanics without cues  in order to minimzie strianing pelvic floor    Time 4    Period Weeks    Status On-going      PT LONG TERM GOAL #4   Title Pt will demo no forward head / thoracic kyphosis across 2 weeks in order to yield better intraabdominal pressure system for pelvic function    Time 4    Period Weeks    Status Achieved      PT LONG TERM GOAL #5   Title Pt will demo IND with resistance band and fitness exercsies with proper technique and modifications to minimize overactivity of pelvic floor    Time 8    Period Weeks    Status On-going                 Plan - 07/19/19 1112    Clinical Impression Statement Pt demo'd decreased pelvic floor mm tightness but required external techniques to address tightness at posterior pelvic floor. Anticipate mobility at coccyx/ sacrum/ SIJ will continue to help pt advance towards her goals. Pt required cues for anterior tilt of pelvic and co-activation of feet to relax the pelvic floor. Pt continues to benefit from skilled PT    Examination-Activity Limitations Other;Hygiene/Grooming    Stability/Clinical Decision Making Evolving/Moderate complexity    Rehab Potential Good    PT  Frequency Monthy    PT Duration 12 weeks    PT Treatment/Interventions Therapeutic activities;Therapeutic exercise;Neuromuscular re-education;Manual techniques;Patient/family education;Energy conservation;Taping;Moist Heat;Scar mobilization;Gait training    Consulted and Agree with Plan of Care Patient           Patient will benefit from skilled therapeutic intervention in order to improve the following deficits and impairments:  Decreased activity tolerance, Hypomobility, Decreased mobility, Decreased range of motion, Decreased coordination, Improper body mechanics, Decreased endurance, Increased fascial restricitons, Increased muscle spasms, Hypermobility, Postural dysfunction  Visit Diagnosis: Other muscle spasm  Abnormal posture  Other lack of coordination     Problem List There are no problems to display for this patient.   Jerl Mina ,PT, DPT, E-RYT  07/19/2019, 1:02 PM  [place] MAIN [facility] SERVICES [address] [place], [place], [address] Phone: [phone]   Fax:  [phone]  Name: Regina Pittman MRN: [id number] Date of Birth: 08/28/2000

## 2019-07-19 NOTE — Patient Instructions (Signed)
Strap stretches:    Happy baby   Hamstring ( staight knee and out)    Seated with half butterfly, sit on top of pillow for anterior tilt of pelvis  Anchor heel down  5 breaths    ___  Good to avoid flipflips  Relax big toe ballmound down, and pelvic tilts in standing to notice lengthening of pelvic floor in anterior tilt  Of pelvis

## 2019-07-24 ENCOUNTER — Other Ambulatory Visit: Payer: Self-pay

## 2019-07-24 ENCOUNTER — Ambulatory Visit: Payer: BC Managed Care – PPO | Admitting: Physical Therapy

## 2019-07-24 DIAGNOSIS — R293 Abnormal posture: Secondary | ICD-10-CM

## 2019-07-24 DIAGNOSIS — R278 Other lack of coordination: Secondary | ICD-10-CM

## 2019-07-24 DIAGNOSIS — M62838 Other muscle spasm: Secondary | ICD-10-CM | POA: Diagnosis not present

## 2019-07-24 NOTE — Patient Instructions (Signed)
°  PLEATS: BALLERINA  Heel raises in ballerina position, pressing rolled towel between heels  Hands at a corner of a wall   Knees bent pointed out like a "v" , navel ( center of mass) more forward  Heels together as you lift, pointed out like a "v"  KNEES ARE ALIGNED BEHIND THE TOES TO MINIMIZE STRAIN ON THE KNEES Lower heels down slow with your  navel ( center of mass) more forward to avoid dropping down fast and rocking more weight back onto heels   10 reps     _________  Step ups   Step up L up, R up, L down, R down, exhale up and down, lower heel slow and controlled, shoulder slightly forward, center of mass moves after ballmounds contacts  10 x 3 reps each   _________

## 2019-07-25 NOTE — Therapy (Signed)
[place] MAIN [facility] SERVICES [address] [place], [place], [address] Phone: [phone]   Fax:  [phone]  Physical Therapy Treatment  Patient Details  Name: Regina Pittman MRN: [id number] Date of Birth: Dec 15, 2000 Referring Provider (PT): Carrie Mew    Encounter Date: 07/24/2019   PT End of Session - 07/24/19 1705    Number of Visits 14    Date for PT Re-Evaluation 09/04/19    PT Start Time 4627    PT Stop Time 1700    PT Time Calculation (min) 56 min    Activity Tolerance Patient tolerated treatment well;No increased pain    Behavior During Therapy [facility] for tasks assessed/performed           No past medical history on file.  There were no vitals filed for this visit.   Subjective Assessment - 07/24/19 1604    Subjective Pt reported no issues from last session. Pt started her period today    Pertinent History Physical activity: dance as a child to teenage years. Using treadmill daily at home 1 hours without stretching routine. At college, pt is walking all the time but doesnot keep up with an exercise routine.  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 denied LBP, fall onto tailbone.Pt sleeps on her belly.              [facility] PT Assessment - 07/25/19 0958      Palpation   SI assessment  hypomobile L PSIS     Palpation comment tight extensor mm B ( L > R) , hypomobile fibula ( lacking tightness) B                          [facility] Adult PT Treatment/Exercise - 07/25/19 0957      Neuro Re-ed    Neuro Re-ed Details  cued for more PF/INV in new exercsies       Modalities   Modalities Moist Heat      Moist Heat Therapy   Number Minutes Moist Heat 5 Minutes    Moist Heat Location --   antetrior legs     Manual Therapy   Manual therapy comments STM/MWM at extensor mm L > R at leg , fibula mob to promote DF/EV, PA mob, rotational mob at L PSIS                           PT Long Term Goals - 06/27/19 1547      PT LONG TERM GOAL #1   Title Pt will demo decreased muscle tightness at pelvic floor all 3 layers  and demo proper lengthening to faciliate insertion of tampon    Time 8    Period Weeks    Status Partially Met      PT LONG TERM GOAL #2   Title Pt will report being able to wear a tampon for 4 hours without discomfort in order to improve QOL    Time 10    Period Weeks    Status On-going      PT LONG TERM GOAL #3   Title Pt will demo proper body mechanics without cues  in order to minimzie strianing pelvic floor    Time 4    Period Weeks    Status On-going      PT LONG TERM GOAL #4   Title Pt  will demo no forward head / thoracic kyphosis across 2 weeks in order to yield better intraabdominal pressure system for pelvic function    Time 4    Period Weeks    Status Achieved      PT LONG TERM GOAL #5   Title Pt will demo IND with resistance band and fitness exercsies with proper technique and modifications to minimize overactivity of pelvic floor    Time 8    Period Weeks    Status On-going                 Plan - 07/25/19 0953    Clinical Impression Statement Pt is progressing well with decreasing tightness along lower kinetic chain which will help with less pelvic floor overactivty. External technique with regional interdependent approaches were applied today. Pt required cues for more hip abd/ER, more transverse arch co-activation at the feet to minimize genu valgus/ pronation. Upcoming sessions will focus on return to fuitness exercises as well. Pt continues to benefit from skilled PT    Examination-Activity Limitations Other;Hygiene/Grooming    Stability/Clinical Decision Making Evolving/Moderate complexity    Rehab Potential Good    PT Frequency Monthy    PT Duration 12 weeks    PT Treatment/Interventions Therapeutic activities;Therapeutic exercise;Neuromuscular re-education;Manual  techniques;Patient/family education;Energy conservation;Taping;Moist Heat;Scar mobilization;Gait training    Consulted and Agree with Plan of Care Patient           Patient will benefit from skilled therapeutic intervention in order to improve the following deficits and impairments:  Decreased activity tolerance, Hypomobility, Decreased mobility, Decreased range of motion, Decreased coordination, Improper body mechanics, Decreased endurance, Increased fascial restricitons, Increased muscle spasms, Hypermobility, Postural dysfunction  Visit Diagnosis: Other muscle spasm  Abnormal posture  Other lack of coordination     Problem List There are no problems to display for this patient.   Jerl Mina ,PT, DPT, E-RYT  07/25/2019, 10:02 AM  [place] MAIN [facility] SERVICES [address] [place], [place], [address] Phone: [phone]   Fax:  [phone]  Name: Regina Pittman MRN: [id number] Date of Birth: 2000-02-20

## 2019-07-31 ENCOUNTER — Ambulatory Visit: Payer: BC Managed Care – PPO | Attending: Physician Assistant | Admitting: Physical Therapy

## 2019-07-31 ENCOUNTER — Other Ambulatory Visit: Payer: Self-pay

## 2019-07-31 DIAGNOSIS — R278 Other lack of coordination: Secondary | ICD-10-CM | POA: Insufficient documentation

## 2019-07-31 DIAGNOSIS — M62838 Other muscle spasm: Secondary | ICD-10-CM | POA: Diagnosis not present

## 2019-07-31 DIAGNOSIS — R293 Abnormal posture: Secondary | ICD-10-CM | POA: Insufficient documentation

## 2019-07-31 DIAGNOSIS — M533 Sacrococcygeal disorders, not elsewhere classified: Secondary | ICD-10-CM | POA: Insufficient documentation

## 2019-07-31 NOTE — Patient Instructions (Addendum)
Step back with opposite arm up  30 reps  Push ballmounds down without knee pointing inwards   __  step with L foot forward, heel raises together, 3 steps , lower heel slow and then other foot forward  10 reps  Do not lock knees

## 2019-07-31 NOTE — Therapy (Signed)
[place] MAIN [facility] SERVICES [address] [place], [place], [address] Phone: [phone]   Fax:  [phone]  Physical Therapy Treatment  Patient Details  Name: Regina Pittman MRN: [id number] Date of Birth: 2000/10/03 Referring Provider (PT): Carrie Mew    Encounter Date: 07/31/2019   PT End of Session - 07/31/19 1710    Visit Number 6    Number of Visits 14    Date for PT Re-Evaluation 09/04/19    PT Start Time 4944    PT Stop Time 1710    PT Time Calculation (min) 62 min    Activity Tolerance Patient tolerated treatment well;No increased pain    Behavior During Therapy [facility] for tasks assessed/performed             Subjective Assessment - 07/31/19 1704    Subjective Pt reported she was able to insert a tampon halfway in    Pertinent History Physical activity: dance as a child to teenage years. Using treadmill daily at home 1 hours without stretching routine. At college, pt is walking all the time but doesnot keep up with an exercise routine.  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 denied LBP, fall onto tailbone.Pt sleeps on her belly.              [facility] PT Assessment - 07/31/19 1708      Coordination   Gross Motor Movements are Fluid and Coordinated --   chest breathing, pelvic floor dyscoordination                     Pelvic Floor Special Questions - 07/31/19 1706    External Palpation hypomobile L PSIS SIJ     Pelvic Floor Internal Exam pt consented verbally without contraindications     Exam Type Vaginal    Palpation tightness/ tenderness  11-2 oclock and 3-6 o'clock at 3rd layer              [facility] Adult PT Treatment/Exercise - 07/31/19 1704      Neuro Re-ed    Neuro Re-ed Details  cued for less chest breathing, more co=activatin of the feet and alignment of knees and hips to relax pelvic floor, and in standing exercises, no locking knees       Modalities   Modalities Moist Heat      Moist Heat Therapy   Number Minutes Moist Heat 5 Minutes    Moist Heat Location --   perineum/ sacrum     Manual Therapy   Manual therapy comments L SIJ Grade III mobs PSIS     Internal Pelvic Floor MWM . STM at 11-2 oclock and 3-6 o'clock at 3rd layer                        PT Long Term Goals - 06/27/19 1547      PT LONG TERM GOAL #1   Title Pt will demo decreased muscle tightness at pelvic floor all 3 layers  and demo proper lengthening to faciliate insertion of tampon    Time 8    Period Weeks    Status Partially Met      PT LONG TERM GOAL #2   Title Pt will report being able to wear a tampon for 4 hours without discomfort in order to improve QOL    Time 10    Period Weeks    Status On-going  PT LONG TERM GOAL #3   Title Pt will demo proper body mechanics without cues  in order to minimzie strianing pelvic floor    Time 4    Period Weeks    Status On-going      PT LONG TERM GOAL #4   Title Pt will demo no forward head / thoracic kyphosis across 2 weeks in order to yield better intraabdominal pressure system for pelvic function    Time 4    Period Weeks    Status Achieved      PT LONG TERM GOAL #5   Title Pt will demo IND with resistance band and fitness exercsies with proper technique and modifications to minimize overactivity of pelvic floor    Time 8    Period Weeks    Status On-going                 Plan - 07/31/19 1710    Clinical Impression Statement Pt required further manual Tx at L SIJ and excessive ceus for co-activation of lower kinetic chain to minimize pelvic overactivity. Pt demo'd decreased pelvic floor mm tightness. Pt is progressing well as pt reported she was able to insert a tampon halfway. Anticipate pt will be able to achieve insertion of full tampon with the improvements achieved today and with upcoming sessions. Incorporated standing propioception exercises with cues for less ankle  inversion when in PF. These exercises were made as dance movements as pt danced in the past for better learning styles. Pt continues to benefit from skilled PT    Examination-Activity Limitations Other;Hygiene/Grooming    Stability/Clinical Decision Making Evolving/Moderate complexity    Rehab Potential Good    PT Frequency Monthy    PT Duration 12 weeks    PT Treatment/Interventions Therapeutic activities;Therapeutic exercise;Neuromuscular re-education;Manual techniques;Patient/family education;Energy conservation;Taping;Moist Heat;Scar mobilization;Gait training    Consulted and Agree with Plan of Care Patient           Patient will benefit from skilled therapeutic intervention in order to improve the following deficits and impairments:  Decreased activity tolerance, Hypomobility, Decreased mobility, Decreased range of motion, Decreased coordination, Improper body mechanics, Decreased endurance, Increased fascial restricitons, Increased muscle spasms, Hypermobility, Postural dysfunction  Visit Diagnosis: Other muscle spasm  Abnormal posture  Other lack of coordination     Problem List There are no problems to display for this patient.   Jerl Mina ,PT, DPT, E-RYT   07/31/2019, 5:27 PM  [place] MAIN [facility] SERVICES [address] [place], [place], [address] Phone: [phone]   Fax:  [phone]  Name: Brytney Somes MRN: [id number] Date of Birth: 11-29-2000

## 2019-08-06 ENCOUNTER — Ambulatory Visit: Payer: BC Managed Care – PPO | Admitting: Physical Therapy

## 2019-08-06 ENCOUNTER — Other Ambulatory Visit: Payer: Self-pay

## 2019-08-06 DIAGNOSIS — M62838 Other muscle spasm: Secondary | ICD-10-CM

## 2019-08-06 DIAGNOSIS — R278 Other lack of coordination: Secondary | ICD-10-CM

## 2019-08-06 DIAGNOSIS — R293 Abnormal posture: Secondary | ICD-10-CM

## 2019-08-06 NOTE — Therapy (Signed)
[place] MAIN [facility] SERVICES [address] [place], [place], [address] Phone: [phone]   Fax:  [phone]  Physical Therapy Treatment  Patient Details  Name: Regina Pittman MRN: [id number] Date of Birth: [birth year] Referring Provider (PT): Carrie Mew    Encounter Date: 08/06/2019   PT End of Session - 08/06/19 1738    Visit Number 7    Number of Visits 14    Date for PT Re-Evaluation 09/04/19    PT Start Time 6438    PT Stop Time 1700    PT Time Calculation (min) 56 min    Activity Tolerance Patient tolerated treatment well;No increased pain    Behavior During Therapy [facility] for tasks assessed/performed             Subjective Assessment - 08/06/19 1611    Subjective Pt tried using dilator for only a few minutes but it was not inserted very far. Pt felt sore after last session but it went away    Pertinent History Physical activity: dance as a child to teenage years. Using treadmill daily at home 1 hours without stretching routine. At college, pt is walking all the time but doesnot keep up with an exercise routine.  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 denied LBP, fall onto tailbone.Pt sleeps on her belly.                          Pelvic Floor Special Questions - 08/06/19 1659    Pelvic Floor Internal Exam pt consented verbally without contraindications     Exam Type Vaginal    Palpation no flinching  tenderness  11-2 oclock and 3-6 o'clock at 3rd layer .  Pt able to relax IND without STM/MWM, cued for feet propioception for relaxation               [facility] Adult PT Treatment/Exercise - 08/06/19 1657      Neuro Re-ed    Neuro Re-ed Details  cued for more transverse arch co-activation to relax and applied pain science education      Modalities   Modalities Moist Heat      Moist Heat Therapy   Moist Heat Location --   perineum/ sacrum     Manual Therapy    Manual therapy comments L SIJ Grade III mobs rotational/ FADDIR PA mobs to minimize lower hip abductor mm tightness     Internal Pelvic Floor cued for more transverse arch co-activation to relax and applied pain science education                         PT Long Term Goals - 06/27/19 1547      PT LONG TERM GOAL #1   Title Pt will demo decreased muscle tightness at pelvic floor all 3 layers  and demo proper lengthening to faciliate insertion of tampon    Time 8    Period Weeks    Status Partially Met      PT LONG TERM GOAL #2   Title Pt will report being able to wear a tampon for 4 hours without discomfort in order to improve QOL    Time 10    Period Weeks    Status On-going      PT LONG TERM GOAL #3   Title Pt will demo proper body mechanics without cues  in order to minimzie  strianing pelvic floor    Time 4    Period Weeks    Status On-going      PT LONG TERM GOAL #4   Title Pt will demo no forward head / thoracic kyphosis across 2 weeks in order to yield better intraabdominal pressure system for pelvic function    Time 4    Period Weeks    Status Achieved      PT LONG TERM GOAL #5   Title Pt will demo IND with resistance band and fitness exercsies with proper technique and modifications to minimize overactivity of pelvic floor    Time 8    Period Weeks    Status On-going                 Plan - 08/06/19 1739    Clinical Impression Statement Pt continues to show decreasing pelvic floor mm tightness and able to relax the 1-3rd layer muscles IND after receiving L SIJ mobilizations. Pt required pain science education to have long lasting changes to responding to pelvic pain. Pt requried cues to co-activation of her feet to rpromote more pelvic floor relaxation. Pt continues to benefit from skilled PT.    Examination-Activity Limitations Other;Hygiene/Grooming    Stability/Clinical Decision Making Evolving/Moderate complexity    Rehab Potential Good    PT  Frequency Monthy    PT Duration 12 weeks    PT Treatment/Interventions Therapeutic activities;Therapeutic exercise;Neuromuscular re-education;Manual techniques;Patient/family education;Energy conservation;Taping;Moist Heat;Scar mobilization;Gait training    Consulted and Agree with Plan of Care Patient           Patient will benefit from skilled therapeutic intervention in order to improve the following deficits and impairments:  Decreased activity tolerance, Hypomobility, Decreased mobility, Decreased range of motion, Decreased coordination, Improper body mechanics, Decreased endurance, Increased fascial restricitons, Increased muscle spasms, Hypermobility, Postural dysfunction  Visit Diagnosis: Other muscle spasm  Abnormal posture  Other lack of coordination     Problem List There are no problems to display for this patient.   Jerl Mina ,PT, DPT, E-RYT  08/06/2019, 5:42 PM  [place] MAIN [facility] SERVICES [address] [place], [place], [address] Phone: [phone]   Fax:  [phone]  Name: Regina Pittman MRN: [id number] Date of Birth: 05-Feb-2000

## 2019-08-13 ENCOUNTER — Other Ambulatory Visit: Payer: Self-pay

## 2019-08-13 ENCOUNTER — Ambulatory Visit: Payer: BC Managed Care – PPO | Admitting: Physical Therapy

## 2019-08-13 DIAGNOSIS — R278 Other lack of coordination: Secondary | ICD-10-CM

## 2019-08-13 DIAGNOSIS — R293 Abnormal posture: Secondary | ICD-10-CM

## 2019-08-13 DIAGNOSIS — M62838 Other muscle spasm: Secondary | ICD-10-CM | POA: Diagnosis not present

## 2019-08-13 NOTE — Patient Instructions (Addendum)
Knee circles    Propped foot at stairs, wide knee High arches    Propped forearm on kitchen counter  Standing leg arches high Small foot circles of leg in the air

## 2019-08-13 NOTE — Therapy (Signed)
[place] MAIN [facility] SERVICES [address] [place], [place], [address] Phone: [phone]   Fax:  [phone]  Physical Therapy Treatment  Patient Details  Name: Regina Pittman MRN: [id number] Date of Birth: 10/28/2000 Referring Provider (PT): Carrie Mew    Encounter Date: 08/13/2019   PT End of Session - 08/13/19 6606    Visit Number 8    Number of Visits 14    Date for PT Re-Evaluation 09/04/19    PT Start Time 1600    PT Stop Time 3016    PT Time Calculation (min) 45 min    Activity Tolerance Patient tolerated treatment well;No increased pain    Behavior During Therapy [facility] for tasks assessed/performed              Subjective Assessment - 08/13/19 1605    Subjective Pt reported no soreness from last session    Pertinent History Physical activity: dance as a child to teenage years. Using treadmill daily at home 1 hours without stretching routine. At college, pt is walking all the time but doesnot keep up with an exercise routine.  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 denied LBP, fall onto tailbone.Pt sleeps on her belly.                          Pelvic Floor Special Questions - 08/13/19 1634    Pelvic Floor Internal Exam pt consented verbally without contraindications     Exam Type Vaginal    Palpation 7-9 o'clock at 3rd layer .              [place] Adult PT Treatment/Exercise - 08/13/19 1635      Neuro Re-ed    Neuro Re-ed Details  cued for more posterior pelvic floor relaxation       Modalities   Modalities Moist Heat      Moist Heat Therapy   Moist Heat Location --   perineum/ sacrum     Manual Therapy   Internal Pelvic Floor STM/MWM with breathing at 7-9 oclock 3rd layer , faciliation of more relaxation beyond ATLA/ ischial spine                        PT Long Term Goals - 06/27/19 1547      PT LONG TERM GOAL #1   Title Pt will  demo decreased muscle tightness at pelvic floor all 3 layers  and demo proper lengthening to faciliate insertion of tampon    Time 8    Period Weeks    Status Partially Met      PT LONG TERM GOAL #2   Title Pt will report being able to wear a tampon for 4 hours without discomfort in order to improve QOL    Time 10    Period Weeks    Status On-going      PT LONG TERM GOAL #3   Title Pt will demo proper body mechanics without cues  in order to minimzie strianing pelvic floor    Time 4    Period Weeks    Status On-going      PT LONG TERM GOAL #4   Title Pt will demo no forward head / thoracic kyphosis across 2 weeks in order to yield better intraabdominal pressure system for pelvic function    Time 4    Period Weeks  Status Achieved      PT LONG TERM GOAL #5   Title Pt will demo IND with resistance band and fitness exercsies with proper technique and modifications to minimize overactivity of pelvic floor    Time 8    Period Weeks    Status On-going                 Plan - 08/13/19 1644    Clinical Impression Statement Pt demo'd less pelvic floor mm tightness with remaining tightness more R posterior deep layers. Manual Tx helped to lengthen this area. Progressed pt to more stretches and SLS hip mobility exercises. Pt is ready for dilator education and use and plan to incorporate at next session. Pt continues to benefit from skilled PT.    Examination-Activity Limitations Other;Hygiene/Grooming    Stability/Clinical Decision Making Evolving/Moderate complexity    Rehab Potential Good    PT Frequency Monthy    PT Duration 12 weeks    PT Treatment/Interventions Therapeutic activities;Therapeutic exercise;Neuromuscular re-education;Manual techniques;Patient/family education;Energy conservation;Taping;Moist Heat;Scar mobilization;Gait training    Consulted and Agree with Plan of Care Patient           Patient will benefit from skilled therapeutic intervention in order to  improve the following deficits and impairments:  Decreased activity tolerance, Hypomobility, Decreased mobility, Decreased range of motion, Decreased coordination, Improper body mechanics, Decreased endurance, Increased fascial restricitons, Increased muscle spasms, Hypermobility, Postural dysfunction  Visit Diagnosis: Other muscle spasm  Abnormal posture  Other lack of coordination     Problem List There are no problems to display for this patient.   Jerl Mina ,PT, DPT, E-RYT  08/13/2019, 4:57 PM  [place] MAIN [facility] SERVICES [address] [place], [place], [address] Phone: [phone]   Fax:  [phone]  Name: Regina Pittman MRN: [id number] Date of Birth: April 16, 2000

## 2019-08-20 ENCOUNTER — Other Ambulatory Visit: Payer: Self-pay

## 2019-08-20 ENCOUNTER — Ambulatory Visit: Payer: BC Managed Care – PPO | Admitting: Physical Therapy

## 2019-08-20 DIAGNOSIS — R278 Other lack of coordination: Secondary | ICD-10-CM

## 2019-08-20 DIAGNOSIS — M62838 Other muscle spasm: Secondary | ICD-10-CM

## 2019-08-20 DIAGNOSIS — R293 Abnormal posture: Secondary | ICD-10-CM

## 2019-08-21 NOTE — Therapy (Signed)
[place] MAIN [facility] SERVICES [address] [place], [place], [address] Phone: [phone]   Fax:  [phone]  Physical Therapy Treatment  Patient Details  Name: Regina Pittman MRN: [id number] Date of Birth: 05-Oct-2000 Referring Provider (PT): Carrie Mew    Encounter Date: 08/20/2019   PT End of Session - 08/20/19 1608    Visit Number 9    Number of Visits 14    Date for PT Re-Evaluation 09/04/19    PT Start Time 3524    PT Stop Time 8185    PT Time Calculation (min) 45 min    Activity Tolerance Patient tolerated treatment well;No increased pain    Behavior During Therapy [facility] for tasks assessed/performed             Subjective Assessment - 08/20/19 1608    Subjective Pt reported tiny bit of soreness but not really. Pt is on her menstrual cycle today    Pertinent History Physical activity: dance as a child to teenage years. Using treadmill daily at home 1 hours without stretching routine. At college, pt is walking all the time but doesnot keep up with an exercise routine.  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 denied LBP, fall onto tailbone.Pt sleeps on her belly.              [facility] PT Assessment - 08/21/19 1552      Palpation   SI assessment  tightness  ischial tuberosity/ obt int / coccygeus B                          [facility] Adult PT Treatment/Exercise - 08/21/19 1550      Therapeutic Activites    Other Therapeutic Activities guided pt on insertion of tampon with plastic applicator       Neuro Re-ed    Neuro Re-ed Details  cued for visualization and practicing pelvic floor lengthening prior to insertion of tampon       Modalities   Modalities --      Manual Therapy   Manual therapy comments STM/MWM at ischial tuberosity/ obt int / coccygeus B                        PT Long Term Goals - 06/27/19 1547      PT LONG TERM GOAL #1   Title  Pt will demo decreased muscle tightness at pelvic floor all 3 layers  and demo proper lengthening to faciliate insertion of tampon    Time 8    Period Weeks    Status Partially Met      PT LONG TERM GOAL #2   Title Pt will report being able to wear a tampon for 4 hours without discomfort in order to improve QOL    Time 10    Period Weeks    Status On-going      PT LONG TERM GOAL #3   Title Pt will demo proper body mechanics without cues  in order to minimzie strianing pelvic floor    Time 4    Period Weeks    Status On-going      PT LONG TERM GOAL #4   Title Pt will demo no forward head / thoracic kyphosis across 2 weeks in order to yield better intraabdominal pressure system for pelvic function    Time 4    Period Weeks  Status Achieved      PT LONG TERM GOAL #5   Title Pt will demo IND with resistance band and fitness exercsies with proper technique and modifications to minimize overactivity of pelvic floor    Time 8    Period Weeks    Status On-going                 Plan - 08/21/19 1554    Clinical Impression Statement Pt required external manual Tx at posterior pelvic floor mm today and achieved the insertion of a small tampon with a plastic application without pain. Pt continues to benefit from skilled PT    Examination-Activity Limitations Other;Hygiene/Grooming    Stability/Clinical Decision Making Evolving/Moderate complexity    Rehab Potential Good    PT Frequency Monthy    PT Duration 12 weeks    PT Treatment/Interventions Therapeutic activities;Therapeutic exercise;Neuromuscular re-education;Manual techniques;Patient/family education;Energy conservation;Taping;Moist Heat;Scar mobilization;Gait training    Consulted and Agree with Plan of Care Patient           Patient will benefit from skilled therapeutic intervention in order to improve the following deficits and impairments:  Decreased activity tolerance, Hypomobility, Decreased mobility, Decreased  range of motion, Decreased coordination, Improper body mechanics, Decreased endurance, Increased fascial restricitons, Increased muscle spasms, Hypermobility, Postural dysfunction  Visit Diagnosis: Other muscle spasm  Abnormal posture  Other lack of coordination     Problem List There are no problems to display for this patient.   Jerl Mina ,PT, DPT, E-RYT  08/21/2019, 3:54 PM  [place] MAIN [facility] SERVICES [address] [place], [place], [address] Phone: [phone]   Fax:  [phone]  Name: Regina Pittman MRN: [id number] Date of Birth: 02-27-2000

## 2019-08-27 ENCOUNTER — Other Ambulatory Visit: Payer: Self-pay

## 2019-08-27 ENCOUNTER — Ambulatory Visit: Payer: BC Managed Care – PPO | Attending: Physician Assistant | Admitting: Physical Therapy

## 2019-08-27 DIAGNOSIS — R293 Abnormal posture: Secondary | ICD-10-CM | POA: Insufficient documentation

## 2019-08-27 DIAGNOSIS — M62838 Other muscle spasm: Secondary | ICD-10-CM | POA: Insufficient documentation

## 2019-08-27 DIAGNOSIS — R278 Other lack of coordination: Secondary | ICD-10-CM | POA: Insufficient documentation

## 2019-08-27 NOTE — Patient Instructions (Signed)
Use of dilator, small size for 10 min, every other day for 2 weeks   with towel rolled at handle for leverage   Try to see if you are ready for the next size after 2 weeks

## 2019-08-27 NOTE — Therapy (Signed)
[place] MAIN [facility] SERVICES [address] [place], [place], [address] Phone: [phone]   Fax:  [phone]  Physical Therapy Treatment / Progress Note   Patient Details  Name: Regina Pittman MRN: [id number] Date of Birth: 2000-05-13 Referring Provider (PT): Carrie Mew    Encounter Date: 08/27/2019   PT End of Session - 08/27/19 1709    Visit Number 10    Number of Visits 14    Date for PT Re-Evaluation 09/04/19    PT Start Time 3716    PT Stop Time 1700    PT Time Calculation (min) 56 min    Activity Tolerance Patient tolerated treatment well;No increased pain    Behavior During Therapy [facility] for tasks assessed/performed              Subjective Assessment - 08/27/19 1609    Subjective Pt reported she was able to insert small tampons with plastic applicators after last session.    Pertinent History Physical activity: dance as a child to teenage years. Using treadmill daily at home 1 hours without stretching routine. At college, pt is walking all the time but doesnot keep up with an exercise routine.  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 denied LBP, fall onto tailbone.Pt sleeps on her belly.                          Pelvic Floor Special Questions - 08/27/19 1705    Pelvic Floor Internal Exam pt consented verbally without contraindications     Exam Type Vaginal    Palpation tightness at 3rd layer anterior mm              [facility] Adult PT Treatment/Exercise - 08/27/19 1714      Therapeutic Activites    Other Therapeutic Activities explained anatomy/ physiology of pelvic floor functions       Neuro Re-ed    Neuro Re-ed Details  cued for  more lengthening of pelvic floor with more ab expansion       Manual Therapy   Internal Pelvic Floor STM/MWM anterior deep pelvic floor                         PT Long Term Goals - 08/27/19 1710      PT LONG  TERM GOAL #1   Title Pt will demo decreased muscle tightness at pelvic floor all 3 layers  and demo proper lengthening to faciliate insertion of tampon    Time 8    Period Weeks    Status Achieved      PT LONG TERM GOAL #2   Title Pt will report being able to wear a tampon for 4 hours without discomfort in order to improve QOL    Time 10    Period Weeks    Status Achieved      PT LONG TERM GOAL #3   Title Pt will demo proper body mechanics without cues  in order to minimzie straining pelvic floor    Time 4    Period Weeks    Status Achieved      PT LONG TERM GOAL #4   Title Pt will demo no forward head / thoracic kyphosis across 2 weeks in order to yield better intraabdominal pressure system for pelvic function    Time 4    Period Weeks    Status  Achieved      PT LONG TERM GOAL #5   Title Pt will demo IND with resistance band and fitness exercsies with proper technique and modifications to minimize overactivity of pelvic floor    Time 8    Period Weeks    Status Partially Met                 Plan - 08/27/19 1709    Clinical Impression Statement Pt achieved 4/5 goals and is progressing well towards remaining goal. Pt has demo'd decreased pelvic floor and demo'd improved pelvic floor coordination. Pt has been able to insert a small tampon and is working on continued decreasing pelvic floor tightness with use of small dilator. Pt also showed improved flexibility and decreased mm tensions at her B lower kinetic chain, increased SIJ mobility, less genu valgus/ pronation, more upright posture. These improvements continue to help minimize pelvic tensions long term. Pt will progress towards fitness exercises in upcoming session.   Pt continues to benefit from skilled PT.     Examination-Activity Limitations Other;Hygiene/Grooming    Stability/Clinical Decision Making Evolving/Moderate complexity    Rehab Potential Good    PT Frequency Monthy    PT Duration 12 weeks    PT  Treatment/Interventions Therapeutic activities;Therapeutic exercise;Neuromuscular re-education;Manual techniques;Patient/family education;Energy conservation;Taping;Moist Heat;Scar mobilization;Gait training    Consulted and Agree with Plan of Care Patient           Patient will benefit from skilled therapeutic intervention in order to improve the following deficits and impairments:  Decreased activity tolerance, Hypomobility, Decreased mobility, Decreased range of motion, Decreased coordination, Improper body mechanics, Decreased endurance, Increased fascial restricitons, Increased muscle spasms, Hypermobility, Postural dysfunction  Visit Diagnosis: Other muscle spasm  Abnormal posture  Other lack of coordination     Problem List There are no problems to display for this patient.   Jerl Mina ,PT, DPT, E-RYT  08/27/2019, 5:16 PM  [place] MAIN [facility] SERVICES [address] [place], [place], [address] Phone: [phone]   Fax:  [phone]  Name: Edison Wollschlager MRN: [id number] Date of Birth: [birth year]

## 2019-09-03 ENCOUNTER — Other Ambulatory Visit: Payer: Self-pay

## 2019-09-03 ENCOUNTER — Ambulatory Visit: Payer: BC Managed Care – PPO | Admitting: Physical Therapy

## 2019-09-03 DIAGNOSIS — R293 Abnormal posture: Secondary | ICD-10-CM

## 2019-09-03 DIAGNOSIS — M62838 Other muscle spasm: Secondary | ICD-10-CM | POA: Diagnosis not present

## 2019-09-03 DIAGNOSIS — R278 Other lack of coordination: Secondary | ICD-10-CM

## 2019-09-03 NOTE — Patient Instructions (Signed)
Practice proper pelvic floor coordination  Inhale: expand pelvic floor muscles Exhale" "j" scoop, allow pelvic floor to close, lift first before belly sinks   ( not "draw abdominal muscle to spine" or strain with abdominal muscles")   

## 2019-09-04 NOTE — Therapy (Addendum)
[place] MAIN [facility] SERVICES [address] [place], [place], [address] Phone: [phone]   Fax:  [phone]  Physical Therapy Treatment  Patient Details  Name: Regina Pittman MRN: [id number] Date of Birth: July 14, 2000 Referring Provider (PT): Carrie Mew    Encounter Date: 09/03/2019   PT End of Session - 09/03/19 1655    Visit Number 11    Date for PT Re-Evaluation 11/12/19    PT Start Time 0388    PT Stop Time 1700    PT Time Calculation (min) 57 min    Activity Tolerance Patient tolerated treatment well;No increased pain    Behavior During Therapy [facility] for tasks assessed/performed           No past medical history on file.     Subjective Assessment - 09/03/19 1617    Subjective Pt reported she was able to use the small dilator once last week on her own without pain and was able to achieve the same depth.    Pertinent History Physical activity: dance as a child to teenage years. Using treadmill daily at home 1 hours without stretching routine. At college, pt is walking all the time but doesnot keep up with an exercise routine.  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 denied LBP, fall onto tailbone.Pt sleeps on her belly.                     Pelvic Floor Special Questions -         Pelvic Floor Internal Exam pt consented verbally without contraindications      Exam Type Vaginal     Palpation tightness at 3rd layer anterior mm  , obt int                 [facility] Adult PT Treatment/Exercise -                            Neuro Re-ed     Neuro Re-ed Details  Cued for expansion of pelvic floor with smallest dilator ,          Manual Therapy    Internal Pelvic Floor STM/MWM anterior deep pelvic floor   , obt int                                      PT Long Term Goals - 08/27/19 1710      PT LONG TERM GOAL #1   Title Pt will  demo decreased muscle tightness at pelvic floor all 3 layers  and demo proper lengthening to faciliate insertion of tampon    Time 8    Period Weeks    Status Achieved      PT LONG TERM GOAL #2   Title Pt will report being able to wear a tampon for 4 hours without discomfort in order to improve QOL    Time 10    Period Weeks    Status Achieved      PT LONG TERM GOAL #3   Title Pt will demo proper body mechanics without cues  in order to minimzie straining pelvic floor    Time 4    Period Weeks    Status Achieved      PT LONG TERM GOAL #4   Title Pt will demo  no forward head / thoracic kyphosis across 2 weeks in order to yield better intraabdominal pressure system for pelvic function    Time 4    Period Weeks    Status Achieved      PT LONG TERM GOAL #5   Title Pt will demo IND with resistance band and fitness exercsies with proper technique and modifications to minimize overactivity of pelvic floor    Time 8    Period Weeks    Status Partially Met                 Plan - 09/04/19 1701    Clinical Impression Statement Pt continues to show decreased pelvic floor and was able to insert smallest dilator today. Pt required cues for relaxation to use pelvic tilts and breathing which facilitated further insertion. Pt continues to benefit from skilled PT    Examination-Activity Limitations Other;Hygiene/Grooming    Stability/Clinical Decision Making Evolving/Moderate complexity    Rehab Potential Good    PT Frequency Monthy    PT Duration 12 weeks    PT Treatment/Interventions Therapeutic activities;Therapeutic exercise;Neuromuscular re-education;Manual techniques;Patient/family education;Energy conservation;Taping;Moist Heat;Scar mobilization;Gait training    Consulted and Agree with Plan of Care Patient           Patient will benefit from skilled therapeutic intervention in order to improve the following deficits and impairments:  Decreased activity tolerance,  Hypomobility, Decreased mobility, Decreased range of motion, Decreased coordination, Improper body mechanics, Decreased endurance, Increased fascial restricitons, Increased muscle spasms, Hypermobility, Postural dysfunction  Visit Diagnosis: Other muscle spasm  Abnormal posture  Other lack of coordination     Problem List There are no problems to display for this patient.   Jerl Mina ,PT, DPT, E-RYT  09/04/2019, 5:02 PM  [place] MAIN [facility] SERVICES [address] [place], [place], [address] Phone: [phone]   Fax:  [phone]  Name: Regina Pittman MRN: [id number] Date of Birth: [birth year]

## 2019-09-10 ENCOUNTER — Ambulatory Visit: Payer: BC Managed Care – PPO | Admitting: Physical Therapy

## 2019-09-10 ENCOUNTER — Other Ambulatory Visit: Payer: Self-pay

## 2019-09-10 DIAGNOSIS — R278 Other lack of coordination: Secondary | ICD-10-CM

## 2019-09-10 DIAGNOSIS — R293 Abnormal posture: Secondary | ICD-10-CM

## 2019-09-10 DIAGNOSIS — M62838 Other muscle spasm: Secondary | ICD-10-CM | POA: Diagnosis not present

## 2019-09-10 NOTE — Therapy (Signed)
[place] MAIN [facility] SERVICES [address] [place], [place], [address] Phone: [phone]   Fax:  [phone]  Physical Therapy Treatment  Patient Details  Name: Sadeel Fiddler MRN: [id number] Date of Birth: 2000/10/14 Referring Provider (PT): Carrie Mew    Encounter Date: 09/10/2019   PT End of Session - 09/10/19 1723    Visit Number 12    Date for PT Re-Evaluation 11/12/19    PT Start Time 2836    PT Stop Time 1700    PT Time Calculation (min) 56 min    Activity Tolerance Patient tolerated treatment well;No increased pain    Behavior During Therapy [facility] for tasks assessed/performed            Subjective Assessment - 09/10/19 1752    Subjective Pt reported she tried the dilator this past week but did not leave for full  10 min    Pertinent History Physical activity: dance as a child to teenage years. Using treadmill daily at home 1 hours without stretching routine. At college, pt is walking all the time but doesnot keep up with an exercise routine.  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 denied LBP, fall onto tailbone.Pt sleeps on her belly.              [facility] PT Assessment - 09/10/19 1725      Palpation   Palpation comment R adductor/ midfoot hypomobility                         [facility] Adult PT Treatment/Exercise - 09/10/19 1723      Therapeutic Activites    Other Therapeutic Activities adjusted Birkenstocks to make a tighter fit and educated less toe flexion and avoid flip flops       Manual Therapy   Manual therapy comments STM/MWM adductor, distraction/ AP/PA mob midfoot joints, STM/MWM PAD/DAB to promote toe abduction, midfoot obility     Internal Pelvic Floor R obt int ATLA STM/MWM                        PT Long Term Goals - 08/27/19 1710      PT LONG TERM GOAL #1   Title Pt will demo decreased muscle tightness at pelvic floor all 3  layers  and demo proper lengthening to faciliate insertion of tampon    Time 8    Period Weeks    Status Achieved      PT LONG TERM GOAL #2   Title Pt will report being able to wear a tampon for 4 hours without discomfort in order to improve QOL    Time 10    Period Weeks    Status Achieved      PT LONG TERM GOAL #3   Title Pt will demo proper body mechanics without cues  in order to minimzie straining pelvic floor    Time 4    Period Weeks    Status Achieved      PT LONG TERM GOAL #4   Title Pt will demo no forward head / thoracic kyphosis across 2 weeks in order to yield better intraabdominal pressure system for pelvic function    Time 4    Period Weeks    Status Achieved      PT LONG TERM GOAL #5   Title Pt will demo IND with resistance band and fitness exercsies  with proper technique and modifications to minimize overactivity of pelvic floor    Time 8    Period Weeks    Status Partially Met                 Plan - 09/10/19 1723    Clinical Impression Statement Pt required further internal pelvic floor Tx to minimize R sided tightness. Previously tight areas anteriorly have improved. Continued to provide regional interdependent approach to minimize R pelvic floor tightness by addressing tightness at  R adductors mm and hypomobility at midfoot. Adjusted sandals to minimize toe grip and more toe abduction/ ER of knee and thigh on R. Pt continues to benefit from skilled PT    Examination-Activity Limitations Other;Hygiene/Grooming    Stability/Clinical Decision Making Evolving/Moderate complexity    Rehab Potential Good    PT Frequency Monthy    PT Duration 12 weeks    PT Treatment/Interventions Therapeutic activities;Therapeutic exercise;Neuromuscular re-education;Manual techniques;Patient/family education;Energy conservation;Taping;Moist Heat;Scar mobilization;Gait training    Consulted and Agree with Plan of Care Patient           Patient will benefit from  skilled therapeutic intervention in order to improve the following deficits and impairments:  Decreased activity tolerance, Hypomobility, Decreased mobility, Decreased range of motion, Decreased coordination, Improper body mechanics, Decreased endurance, Increased fascial restricitons, Increased muscle spasms, Hypermobility, Postural dysfunction  Visit Diagnosis: Other muscle spasm  Abnormal posture  Other lack of coordination     Problem List There are no problems to display for this patient.   Jerl Mina ,PT, DPT, E-RYT  09/10/2019, 6:01 PM  [place] MAIN [facility] SERVICES [address] [place], [place], [address] Phone: [phone]   Fax:  [phone]  Name: Shyteria Lewis MRN: [id number] Date of Birth: Oct 29, 2000

## 2019-09-17 ENCOUNTER — Other Ambulatory Visit: Payer: Self-pay

## 2019-09-17 ENCOUNTER — Ambulatory Visit: Payer: BC Managed Care – PPO | Admitting: Physical Therapy

## 2019-09-17 DIAGNOSIS — R293 Abnormal posture: Secondary | ICD-10-CM

## 2019-09-17 DIAGNOSIS — M62838 Other muscle spasm: Secondary | ICD-10-CM | POA: Diagnosis not present

## 2019-09-17 DIAGNOSIS — R278 Other lack of coordination: Secondary | ICD-10-CM

## 2019-09-17 NOTE — Therapy (Signed)
[place] [facility] MAIN [facility] SERVICES [address] [place], [place], [address] Phone: [phone]   Fax:  [phone]  Physical Therapy Treatment / Discharge Summary   Patient Details  Name: Regina Pittman MRN: [id number] Date of Birth: Sep 22, 2000 Referring Provider (PT): Merlinda Frederick    Encounter Date: 09/17/2019   PT End of Session - 09/17/19 1606    Visit Number 13    Date for PT Re-Evaluation 11/12/19    PT Start Time 1603    PT Stop Time 1700    PT Time Calculation (min) 57 min    Activity Tolerance Patient tolerated treatment well;No increased pain    Behavior During Therapy [facility] for tasks assessed/performed              Subjective Assessment - 09/17/19 1607    Subjective Pt started her period. Pt plans to use a tampon and she is not concerned about inserting one because last cycle, she had no problems.    Pertinent History Physical activity: dance as a child to teenage years. Using treadmill daily at home 1 hours without stretching routine. At college, pt is walking all the time but doesnot keep up with an exercise routine.  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 denied LBP, fall onto tailbone.Pt sleeps on her belly.              [facility] PT Assessment - 09/17/19 1713      Functional Tests   Functional tests --   posterior tilt of pelvis,required props to maintain lordosis     Single Leg Stance   Comments less IR of thigh, less pronation of feet                          [facility] Adult PT Treatment/Exercise - 09/17/19 1714      Therapeutic Activites    Other Therapeutic Activities reassessed goals, discussed. provided yoga resources, explained props to promote more therapeutic goals with yoga      Neuro Re-ed    Neuro Re-ed Details  cued for use of props to minimize thoracic kyphosis/ posterior tilt of pelvis                        PT Long Term Goals  - 09/17/19 1609      PT LONG TERM GOAL #1   Title Pt will demo decreased muscle tightness at pelvic floor all 3 layers  and demo proper lengthening to faciliate insertion of tampon    Time 8    Period Weeks    Status Achieved      PT LONG TERM GOAL #2   Title Pt will report being able to wear a tampon for 4 hours without discomfort in order to improve QOL    Time 10    Period Weeks    Status Achieved      PT LONG TERM GOAL #3   Title Pt will demo proper body mechanics without cues  in order to minimzie straining pelvic floor    Time 4    Period Weeks    Status Achieved      PT LONG TERM GOAL #4   Title Pt will demo no forward head / thoracic kyphosis across 2 weeks in order to yield better intraabdominal pressure system for pelvic function    Time 4    Period Weeks  Status Achieved      PT LONG TERM GOAL #5   Title Pt will demo IND with resistance band and fitness exercsies with proper technique and modifications to minimize overactivity of pelvic floor    Time 8    Period Weeks    Status Achieved                 Plan - 09/17/19 1736    Clinical Impression Statement Pt has achieved 100% of her goals. Pt has achieved the main goal to insert a tampon and demo'd significantly decreased pelvic floor / hip/ leg mm tightness, improved posture with less slouching, pronation/ genu valgus of knees. Pt 's FOTO score decreased from 13 pts to 8pts, indicating improved function. Pt also is IND with use of dilator and able to insert the smallest size completely. Pt is IND with proper technique and use of props in yoga postures to minimize past orthopedic deficits in order to minimize relapse of Sx when she returns to practicing yoga. Pt was provided resources for yoga that are more therapeutic based .    Pt is ready for d/c at this time.    Examination-Activity Limitations Other;Hygiene/Grooming    Stability/Clinical Decision Making Evolving/Moderate complexity    Rehab Potential  Good    PT Frequency Monthy    PT Duration 12 weeks    PT Treatment/Interventions Therapeutic activities;Therapeutic exercise;Neuromuscular re-education;Manual techniques;Patient/family education;Energy conservation;Taping;Moist Heat;Scar mobilization;Gait training    Consulted and Agree with Plan of Care Patient           Patient will benefit from skilled therapeutic intervention in order to improve the following deficits and impairments:  Decreased activity tolerance, Hypomobility, Decreased mobility, Decreased range of motion, Decreased coordination, Improper body mechanics, Decreased endurance, Increased fascial restricitons, Increased muscle spasms, Hypermobility, Postural dysfunction  Visit Diagnosis: Other muscle spasm  Abnormal posture  Other lack of coordination     Problem List There are no problems to display for this patient.   Mariane Masters ,PT, DPT, E-RYT  09/17/2019, 5:36 PM  [place] [facility] MAIN [facility] SERVICES [address] [place], [place], [address] Phone: [phone]   Fax:  [phone]  Name: Regina Pittman MRN: [id number] Date of Birth: 03-Dec-2000

## 2019-09-24 ENCOUNTER — Ambulatory Visit: Payer: BC Managed Care – PPO | Admitting: Physical Therapy

## 2020-06-25 ENCOUNTER — Other Ambulatory Visit: Payer: Self-pay

## 2020-06-25 ENCOUNTER — Ambulatory Visit: Payer: BC Managed Care – PPO | Admitting: Dermatology

## 2020-06-25 DIAGNOSIS — L710 Perioral dermatitis: Secondary | ICD-10-CM

## 2020-06-25 NOTE — Progress Notes (Signed)
   New Patient Visit  Subjective  Regina Pittman is a 20 y.o. female who presents for the following: Rash (Of the lower face/chin area that started 6-8 weeks ago. Patient was prescribed TMC 0.1% cream to use on aa's but it keeps coming back).  The following portions of the chart were reviewed this encounter and updated as appropriate:   Allergies  Meds  Problems  Med Hx  Surg Hx  Fam Hx      Review of Systems:  No other skin or systemic complaints except as noted in HPI or Assessment and Plan.  Objective  Well appearing patient in no apparent distress; mood and affect are within normal limits.  A focused examination was performed including the face. Relevant physical exam findings are noted in the Assessment and Plan.  Chin Scattered inflammatory papules perioral.  Assessment & Plan  Perioral dermatitis Chin  D/C TMC 0.1% cream.   Samples given of Zilxi foam apply a small amount to aa's BID. If not improving plan to start low dose Doxycycline. Patient to report condition in 1-2 weeks. If doing well will send in a prescription for Zilxi.   Doxycycline should be taken with food to prevent nausea. Do not lay down for 30 minutes after taking. Be cautious with sun exposure and use good sun protection while on this medication. Pregnant women should not take this medication.    Return in about 3 months (around 09/25/2020) for perioral dermatitis.  Maylene Roes, CMA, am acting as scribe for Darden Dates, MD .  Documentation: I have reviewed the above documentation for accuracy and completeness, and I agree with the above.  Darden Dates, MD

## 2020-06-25 NOTE — Patient Instructions (Addendum)
If you have any questions or concerns for your doctor, please call our main line at [phone] and press option 4 to reach your doctor's medical assistant. If no one answers, please leave a voicemail as directed and we will return your call as soon as possible. Messages left after 4 pm will be answered the following business day.   You may also send us a message via MyChart. We typically respond to MyChart messages within 1-2 business days.  For prescription refills, please ask your pharmacy to contact our office. Our fax number is [phone].  If you have an urgent issue when the clinic is closed that cannot wait until the next business day, you can page your doctor at the number below.    Please note that while we do our best to be available for urgent issues outside of office hours, we are not available 24/7.   If you have an urgent issue and are unable to reach us, you may choose to seek medical care at your doctor's office, retail clinic, urgent care center, or emergency room.  If you have a medical emergency, please immediately call 911 or go to the emergency department.  Pager Numbers  - Dr. Kowalski: [phone]  - Dr. Moye: [phone]  - Dr. Stewart: [phone]  In the event of inclement weather, please call our main line at [phone] for an update on the status of any delays or closures.  Dermatology Medication Tips: Please keep the boxes that topical medications come in in order to help keep track of the instructions about where and how to use these. Pharmacies typically print the medication instructions only on the boxes and not directly on the medication tubes.   If your medication is too expensive, please contact our office at [phone] option 4 or send us a message through MyChart.   We are unable to tell what your co-pay for medications will be in advance as this is different depending on your insurance coverage. However, we may be able to find a substitute  medication at lower cost or fill out paperwork to get insurance to cover a needed medication.   If a prior authorization is required to get your medication covered by your insurance company, please allow us 1-2 business days to complete this process.  Drug prices often vary depending on where the prescription is filled and some pharmacies may offer cheaper prices.  The website www.goodrx.com contains coupons for medications through different pharmacies. The prices here do not account for what the cost may be with help from insurance (it may be cheaper with your insurance), but the website can give you the price if you did not use any insurance.  - You can print the associated coupon and take it with your prescription to the pharmacy.  - You may also stop by our office during regular business hours and pick up a GoodRx coupon card.  - If you need your prescription sent electronically to a different pharmacy, notify our office through [facility] MyChart or by phone at [phone] option 4.  Recommend taking Heliocare sun protection supplement daily in sunny weather for additional sun protection. For maximum protection on the sunniest days, you can take up to 2 capsules of regular Heliocare OR take 1 capsule of Heliocare Ultra. For prolonged exposure (such as a full day in the sun), you can repeat your dose of the supplement 4 hours after your first dose. Heliocare can be purchased at [facility] or at [email or web address].    

## 2020-06-30 ENCOUNTER — Encounter: Payer: Self-pay | Admitting: Dermatology

## 2020-07-02 MED ORDER — DOXYCYCLINE MONOHYDRATE 100 MG PO CAPS: 100.0000 mg | ORAL_CAPSULE | Freq: Two times a day (BID) | ORAL | 0 refills | Status: AC

## 2020-07-02 MED ORDER — DOXYCYCLINE HYCLATE 20 MG PO TABS: 20.0000 mg | ORAL_TABLET | Freq: Two times a day (BID) | ORAL | 1 refills | Status: AC

## 2020-07-03 ENCOUNTER — Encounter: Payer: Self-pay | Admitting: Dermatology

## 2021-02-05 ENCOUNTER — Other Ambulatory Visit: Payer: Self-pay | Admitting: Physician Assistant

## 2021-02-05 DIAGNOSIS — K219 Gastro-esophageal reflux disease without esophagitis: Secondary | ICD-10-CM

## 2021-02-05 DIAGNOSIS — R1011 Right upper quadrant pain: Secondary | ICD-10-CM

## 2021-02-09 ENCOUNTER — Ambulatory Visit
Admission: RE | Admit: 2021-02-09 | Discharge: 2021-02-09 | Disposition: A | Discharge status: Home / Self Care | Payer: BC Managed Care – PPO | Source: Ambulatory Visit | Attending: Physician Assistant | Admitting: Physician Assistant

## 2021-02-09 ENCOUNTER — Other Ambulatory Visit: Payer: Self-pay

## 2021-02-09 DIAGNOSIS — K219 Gastro-esophageal reflux disease without esophagitis: Secondary | ICD-10-CM | POA: Insufficient documentation

## 2021-02-09 DIAGNOSIS — R1011 Right upper quadrant pain: Secondary | ICD-10-CM | POA: Insufficient documentation

## 2022-06-01 IMAGING — US US ABDOMEN LIMITED
1 series · 14 of 25 positions shown · non-contrast
Comparison: None.

CLINICAL DATA: RUQ abdominal pain

EXAM:
ULTRASOUND ABDOMEN LIMITED RIGHT UPPER QUADRANT

[Series 1: us abdomen limited · 0.22mm/px · 14 of 56 slices shown]
[im 1/56]
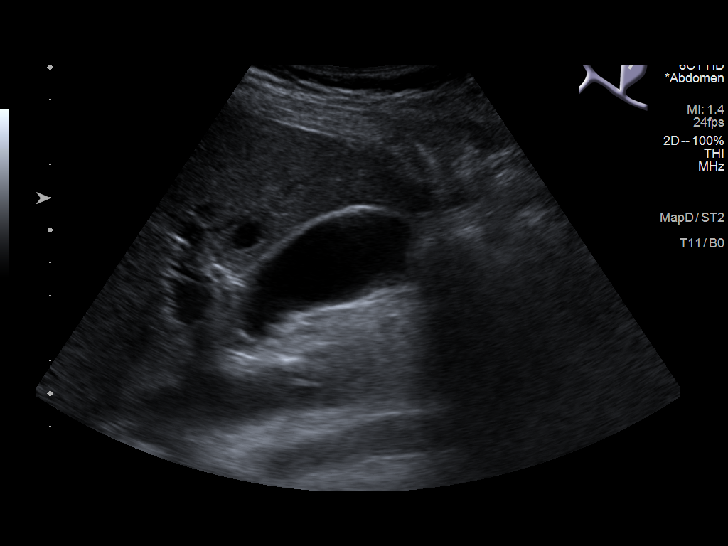
[im 5/56]
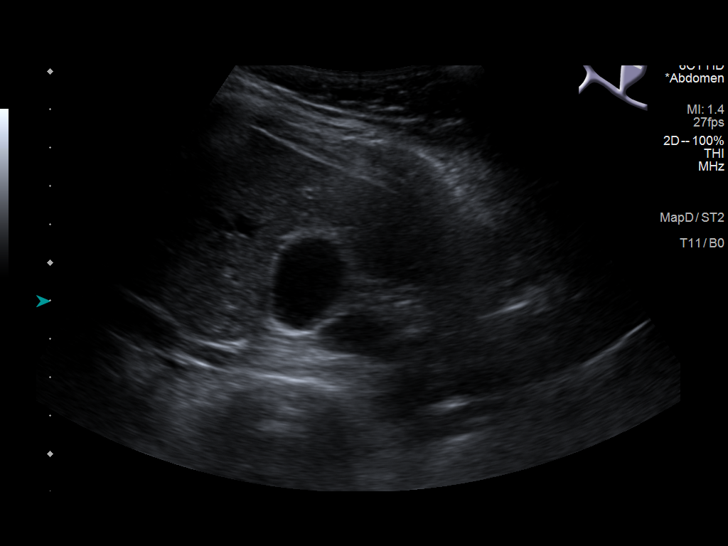
[im 10/56]
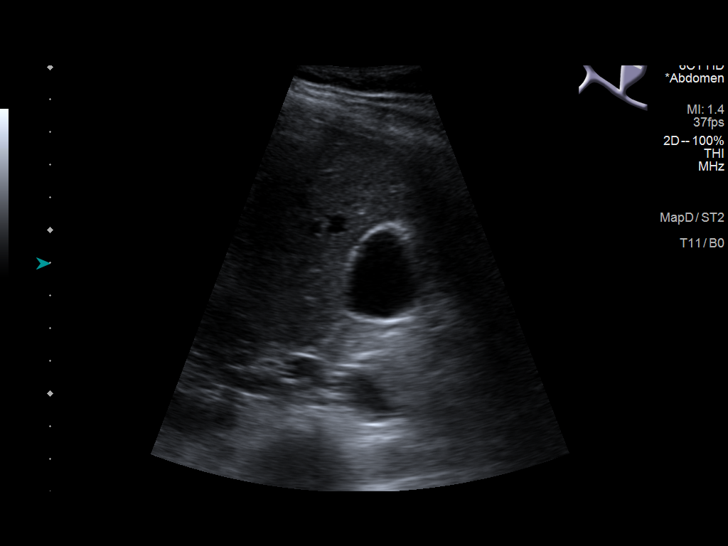
[im 14/56]
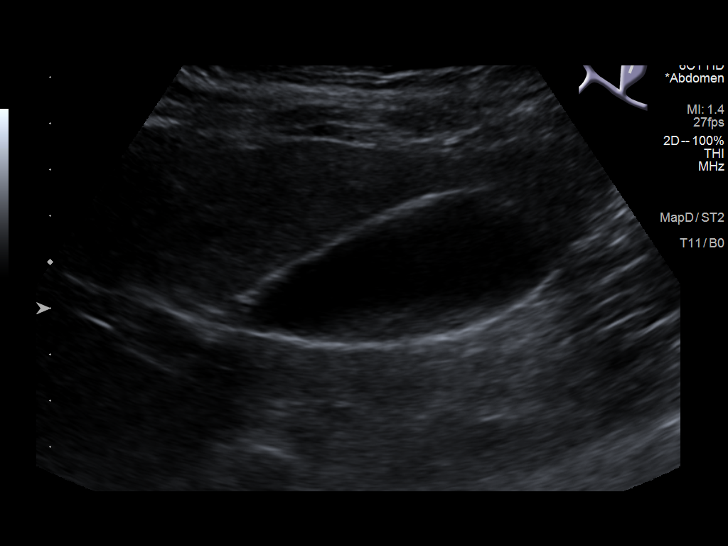
[im 19/56]
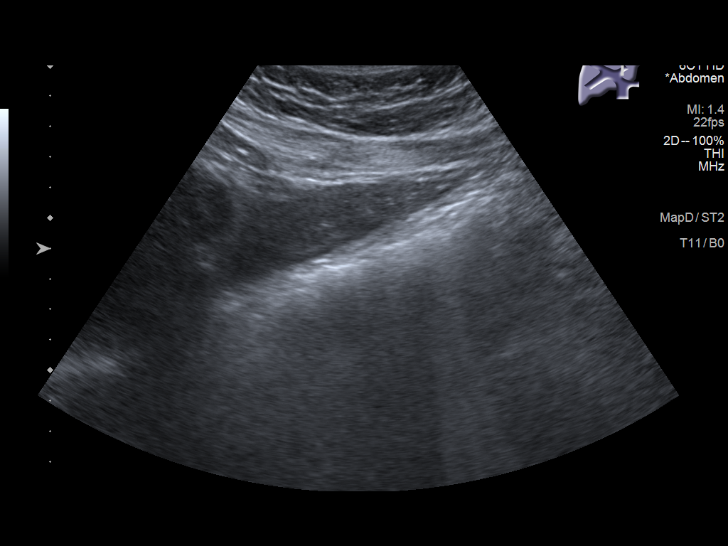
[im 21/56]
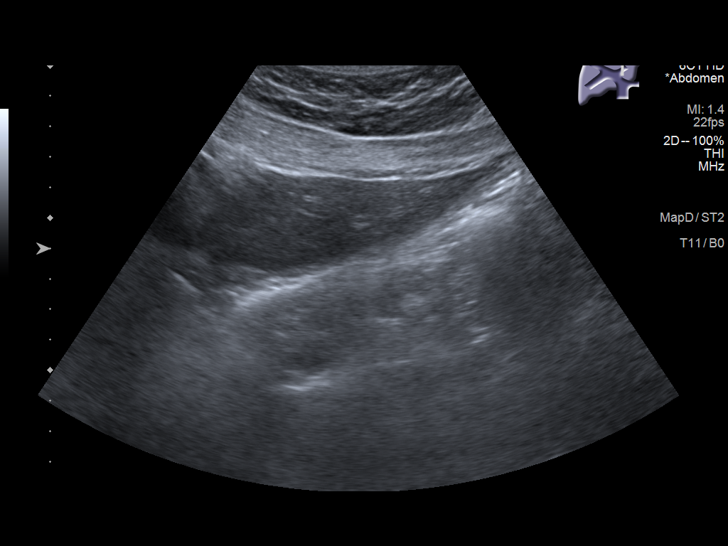
[im 26/56]
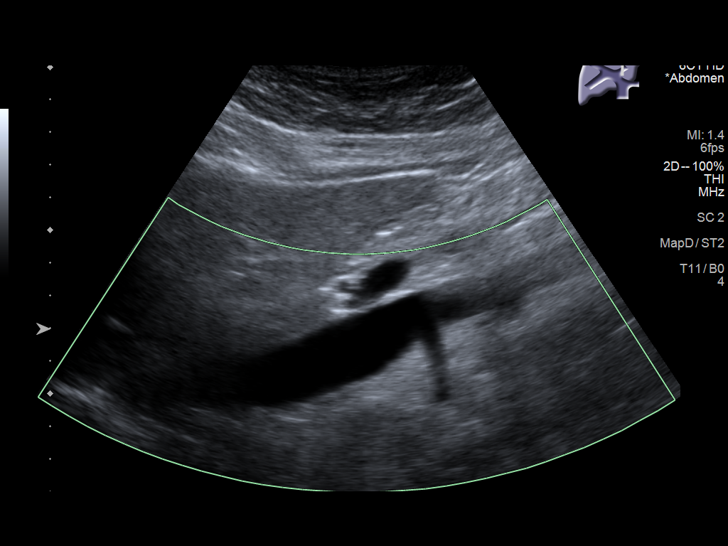
[im 30/56]
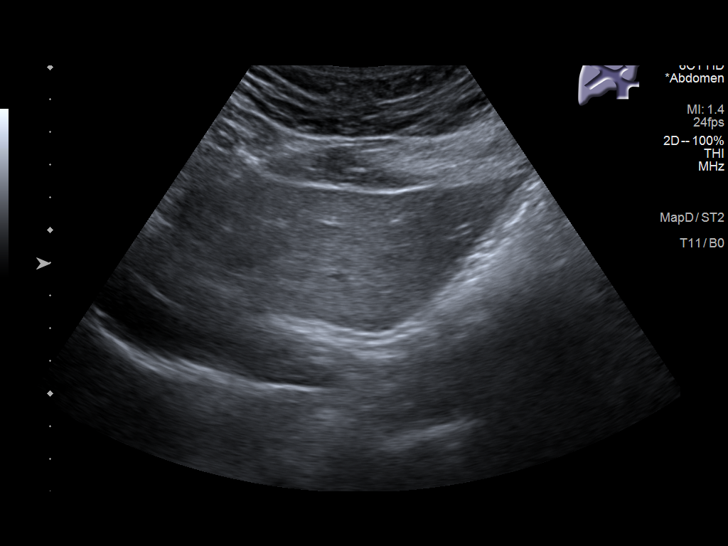
[im 35/56]
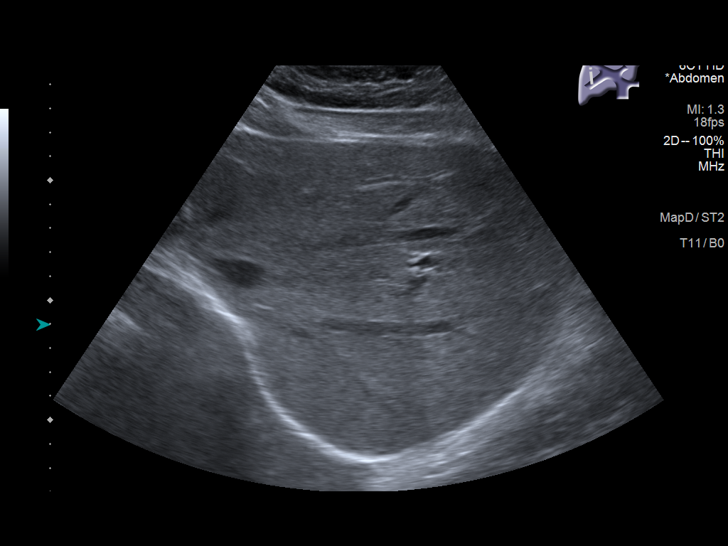
[im 37/56]
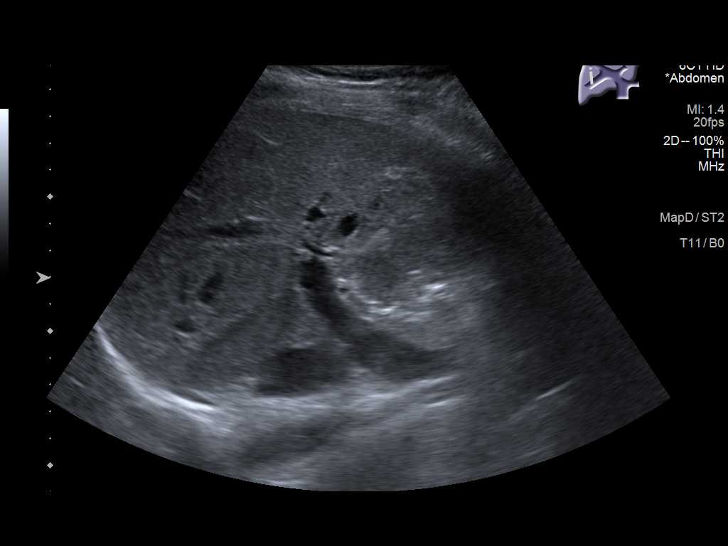
[im 42/56]
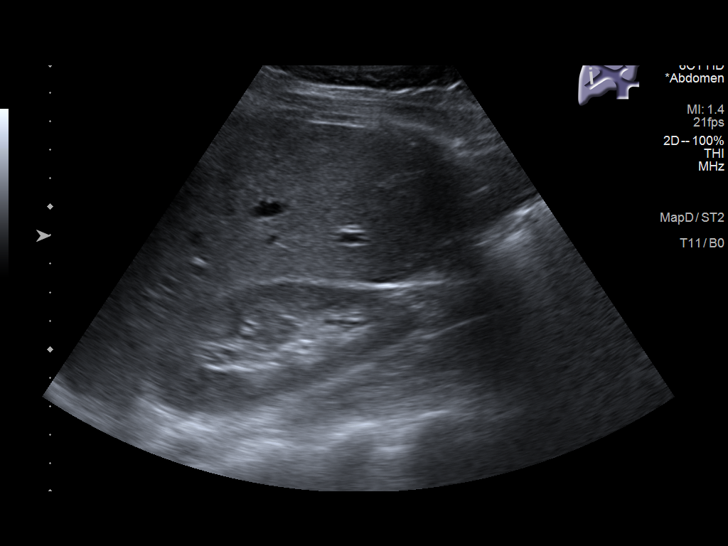
[im 46/56]
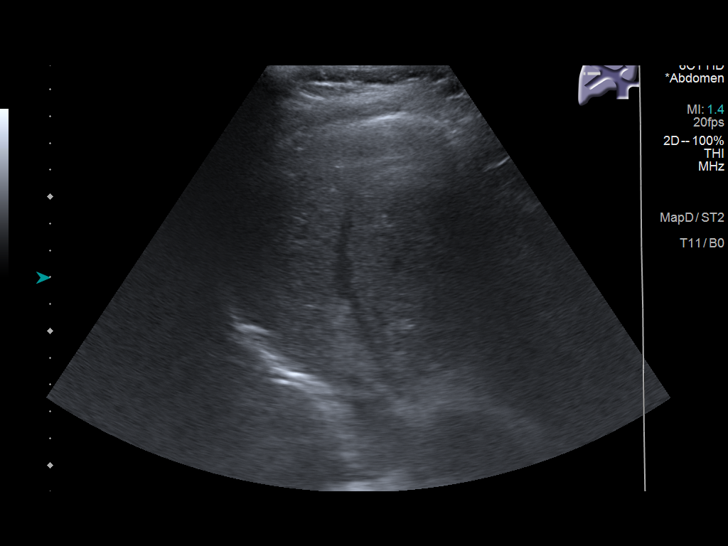
[im 51/56]
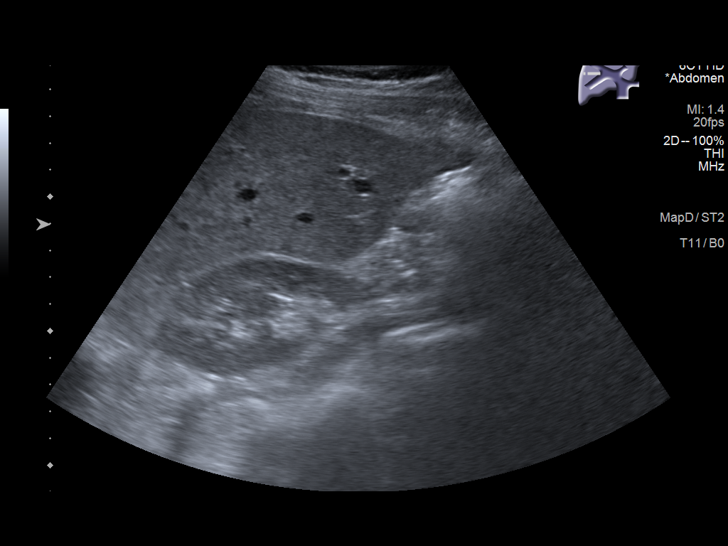
[im 56/56]
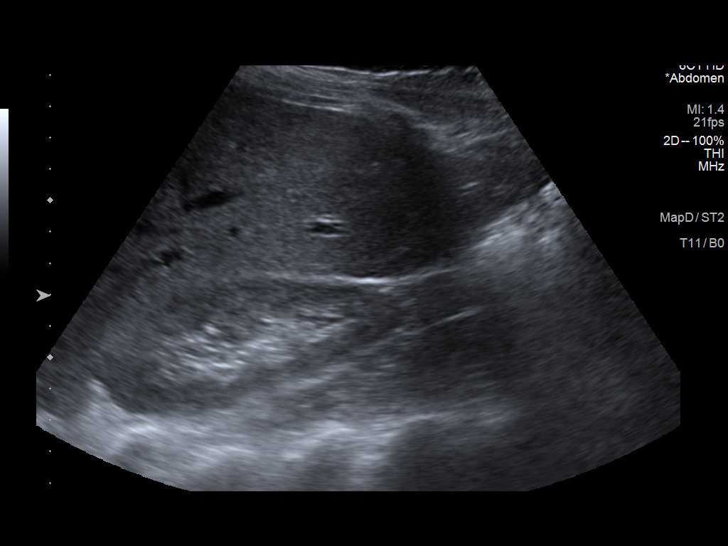

[14 of 25 positions shown; findings below may reference images not displayed]

FINDINGS: Gallbladder:

No gallstones or wall thickening visualized. Questionable small
amount of sludge. No sonographic Murphy sign noted by sonographer.

Common bile duct:

Diameter: 2.2 mm

Liver:

No focal lesion identified. Within normal limits in parenchymal
echogenicity. Portal vein is patent on color Doppler imaging with
normal direction of blood flow towards the liver.

Other: None.
IMPRESSION: Unremarkable right upper quadrant ultrasound.

## 2022-11-15 ENCOUNTER — Other Ambulatory Visit: Payer: Self-pay | Admitting: Physician Assistant

## 2022-11-15 DIAGNOSIS — R519 Headache, unspecified: Secondary | ICD-10-CM

## 2022-11-29 ENCOUNTER — Inpatient Hospital Stay
Admission: RE | Admit: 2022-11-29 | Discharge: 2022-11-29 | Discharge status: Home / Self Care | Payer: 59 | Source: Ambulatory Visit | Attending: Physician Assistant | Admitting: Physician Assistant

## 2022-11-29 DIAGNOSIS — R519 Headache, unspecified: Secondary | ICD-10-CM
